# Patient Record
Sex: Male | Born: 1970 | Race: White | Hispanic: No | Marital: Married | Smoking: Never smoker
Health system: Southern US, Community
[De-identification: ages and names within clinical notes are randomized; demographics above are authoritative.]

## PROBLEM LIST (undated history)

## (undated) DIAGNOSIS — M419 Scoliosis, unspecified: Secondary | ICD-10-CM

## (undated) DIAGNOSIS — I82409 Acute embolism and thrombosis of unspecified deep veins of unspecified lower extremity: Secondary | ICD-10-CM

## (undated) DIAGNOSIS — N433 Hydrocele, unspecified: Secondary | ICD-10-CM

## (undated) DIAGNOSIS — K219 Gastro-esophageal reflux disease without esophagitis: Secondary | ICD-10-CM

## (undated) DIAGNOSIS — L509 Urticaria, unspecified: Secondary | ICD-10-CM

## (undated) DIAGNOSIS — M47819 Spondylosis without myelopathy or radiculopathy, site unspecified: Secondary | ICD-10-CM

## (undated) DIAGNOSIS — I10 Essential (primary) hypertension: Secondary | ICD-10-CM

## (undated) DIAGNOSIS — K449 Diaphragmatic hernia without obstruction or gangrene: Secondary | ICD-10-CM

## (undated) DIAGNOSIS — R569 Unspecified convulsions: Secondary | ICD-10-CM

## (undated) DIAGNOSIS — J309 Allergic rhinitis, unspecified: Secondary | ICD-10-CM

## (undated) DIAGNOSIS — G540 Brachial plexus disorders: Secondary | ICD-10-CM

## (undated) HISTORY — DX: Allergic rhinitis, unspecified: J30.9

## (undated) HISTORY — DX: Gastro-esophageal reflux disease without esophagitis: K21.9

## (undated) HISTORY — DX: Acute embolism and thrombosis of unspecified deep veins of unspecified lower extremity: I82.409

## (undated) HISTORY — PX: OTHER SURGICAL HISTORY: SHX169

## (undated) HISTORY — DX: Essential (primary) hypertension: I10

## (undated) HISTORY — DX: Urticaria, unspecified: L50.9

## (undated) HISTORY — DX: Unspecified convulsions: R56.9

## (undated) HISTORY — DX: Scoliosis, unspecified: M41.9

## (undated) HISTORY — DX: Brachial plexus disorders: G54.0

---

## 2009-03-20 DIAGNOSIS — G540 Brachial plexus disorders: Secondary | ICD-10-CM

## 2009-03-20 DIAGNOSIS — I82409 Acute embolism and thrombosis of unspecified deep veins of unspecified lower extremity: Secondary | ICD-10-CM

## 2009-03-20 DIAGNOSIS — Z86718 Personal history of other venous thrombosis and embolism: Secondary | ICD-10-CM

## 2009-03-20 HISTORY — DX: Brachial plexus disorders: G54.0

## 2009-03-20 HISTORY — DX: Acute embolism and thrombosis of unspecified deep veins of unspecified lower extremity: I82.409

## 2009-03-20 HISTORY — DX: Personal history of other venous thrombosis and embolism: Z86.718

## 2009-11-21 ENCOUNTER — Ambulatory Visit: Payer: Self-pay | Admitting: Vascular Surgery

## 2009-11-21 ENCOUNTER — Inpatient Hospital Stay (HOSPITAL_COMMUNITY): Admission: EM | Admit: 2009-11-21 | Discharge: 2009-11-29 | Payer: Self-pay | Admitting: *Deleted

## 2009-11-24 ENCOUNTER — Encounter: Payer: Self-pay | Admitting: Vascular Surgery

## 2010-01-31 HISTORY — PX: RIB RESECTION: SHX5077

## 2010-02-06 HISTORY — PX: VIDEO ASSISTED THORACOSCOPY (VATS)/DECORTICATION: SHX6171

## 2010-03-20 HISTORY — PX: ESOPHAGOGASTRODUODENOSCOPY (EGD) WITH ESOPHAGEAL DILATION: SHX5812

## 2010-06-02 LAB — CBC
HCT: 41.5 % (ref 39.0–52.0)
HCT: 42.9 % (ref 39.0–52.0)
HCT: 43.4 % (ref 39.0–52.0)
HCT: 43.6 % (ref 39.0–52.0)
HCT: 47.8 % (ref 39.0–52.0)
Hemoglobin: 14.9 g/dL (ref 13.0–17.0)
Hemoglobin: 15.1 g/dL (ref 13.0–17.0)
Hemoglobin: 15.3 g/dL (ref 13.0–17.0)
Hemoglobin: 15.4 g/dL (ref 13.0–17.0)
Hemoglobin: 16.8 g/dL (ref 13.0–17.0)
MCH: 30.1 pg (ref 26.0–34.0)
MCH: 30.5 pg (ref 26.0–34.0)
MCH: 30.5 pg (ref 26.0–34.0)
MCH: 30.7 pg (ref 26.0–34.0)
MCH: 30.7 pg (ref 26.0–34.0)
MCH: 30.8 pg (ref 26.0–34.0)
MCHC: 34.6 g/dL (ref 30.0–36.0)
MCHC: 34.7 g/dL (ref 30.0–36.0)
MCHC: 35 g/dL (ref 30.0–36.0)
MCHC: 35.1 g/dL (ref 30.0–36.0)
MCHC: 35.3 g/dL (ref 30.0–36.0)
MCV: 87.1 fL (ref 78.0–100.0)
MCV: 87.2 fL (ref 78.0–100.0)
MCV: 87.6 fL (ref 78.0–100.0)
MCV: 88.1 fL (ref 78.0–100.0)
MCV: 88.5 fL (ref 78.0–100.0)
Platelets: 211 10*3/uL (ref 150–400)
Platelets: 212 10*3/uL (ref 150–400)
Platelets: 222 10*3/uL (ref 150–400)
Platelets: 229 10*3/uL (ref 150–400)
Platelets: 245 10*3/uL (ref 150–400)
Platelets: 247 10*3/uL (ref 150–400)
RBC: 4.71 MIL/uL (ref 4.22–5.81)
RBC: 4.8 MIL/uL (ref 4.22–5.81)
RBC: 4.92 MIL/uL (ref 4.22–5.81)
RBC: 4.99 MIL/uL (ref 4.22–5.81)
RBC: 5.08 MIL/uL (ref 4.22–5.81)
RBC: 5.12 MIL/uL (ref 4.22–5.81)
RDW: 12.8 % (ref 11.5–15.5)
RDW: 12.9 % (ref 11.5–15.5)
RDW: 12.9 % (ref 11.5–15.5)
RDW: 13 % (ref 11.5–15.5)
WBC: 11 10*3/uL — ABNORMAL HIGH (ref 4.0–10.5)
WBC: 6.7 10*3/uL (ref 4.0–10.5)
WBC: 7.1 10*3/uL (ref 4.0–10.5)
WBC: 7.8 10*3/uL (ref 4.0–10.5)
WBC: 8.1 10*3/uL (ref 4.0–10.5)
WBC: 9.6 10*3/uL (ref 4.0–10.5)

## 2010-06-02 LAB — PROTIME-INR
INR: 1.01 (ref 0.00–1.49)
INR: 1.04 (ref 0.00–1.49)
INR: 1.2 (ref 0.00–1.49)
INR: 1.64 — ABNORMAL HIGH (ref 0.00–1.49)
INR: 2.51 — ABNORMAL HIGH (ref 0.00–1.49)
Prothrombin Time: 13.5 seconds (ref 11.6–15.2)
Prothrombin Time: 13.9 seconds (ref 11.6–15.2)
Prothrombin Time: 15.4 seconds — ABNORMAL HIGH (ref 11.6–15.2)
Prothrombin Time: 18.5 seconds — ABNORMAL HIGH (ref 11.6–15.2)
Prothrombin Time: 22.1 seconds — ABNORMAL HIGH (ref 11.6–15.2)

## 2010-06-02 LAB — BASIC METABOLIC PANEL
CO2: 26 mEq/L (ref 19–32)
CO2: 28 mEq/L (ref 19–32)
Calcium: 9 mg/dL (ref 8.4–10.5)
Calcium: 9.1 mg/dL (ref 8.4–10.5)
Chloride: 106 mEq/L (ref 96–112)
Chloride: 99 mEq/L (ref 96–112)
Creatinine, Ser: 1.03 mg/dL (ref 0.4–1.5)
Creatinine, Ser: 1.03 mg/dL (ref 0.4–1.5)
GFR calc Af Amer: >60 mL/min (ref >60)
GFR calc Af Amer: >60 mL/min (ref >60)
GFR calc non Af Amer: >60 mL/min (ref >60)
Glucose, Bld: 104 mg/dL — ABNORMAL HIGH (ref 70–99)
Potassium: 4 mEq/L (ref 3.5–5.1)
Sodium: 136 mEq/L (ref 135–145)
Sodium: 138 mEq/L (ref 135–145)
Sodium: 140 mEq/L (ref 135–145)

## 2010-06-02 LAB — MRSA PCR SCREENING: MRSA by PCR: POSITIVE — AB

## 2010-06-02 LAB — FIBRINOGEN
Fibrinogen: 218 mg/dL (ref 204–475)
Fibrinogen: 314 mg/dL (ref 204–475)
Fibrinogen: 326 mg/dL (ref 204–475)
Fibrinogen: 342 mg/dL (ref 204–475)
Fibrinogen: 387 mg/dL (ref 204–475)
Fibrinogen: 396 mg/dL (ref 204–475)

## 2010-06-02 LAB — DIFFERENTIAL
Lymphocytes Relative: 16 % (ref 12–46)
Lymphs Abs: 1.7 10*3/uL (ref 0.7–4.0)
Monocytes Relative: 9 % (ref 3–12)
Neutrophils Relative %: 71 % (ref 43–77)

## 2010-06-02 LAB — HEPARIN LEVEL (UNFRACTIONATED)
Heparin Unfractionated: 0.31 IU/mL (ref 0.30–0.70)
Heparin Unfractionated: 0.45 IU/mL (ref 0.30–0.70)
Heparin Unfractionated: 0.57 IU/mL (ref 0.30–0.70)
Heparin Unfractionated: 0.58 IU/mL (ref 0.30–0.70)
Heparin Unfractionated: 0.71 IU/mL — ABNORMAL HIGH (ref 0.30–0.70)
Heparin Unfractionated: <0.1 IU/mL — ABNORMAL LOW (ref 0.30–0.70)
Heparin Unfractionated: <0.1 IU/mL — ABNORMAL LOW (ref 0.30–0.70)

## 2010-06-02 LAB — POTASSIUM: Potassium: 4 mEq/L (ref 3.5–5.1)

## 2010-06-02 LAB — APTT: aPTT: 155 seconds — ABNORMAL HIGH (ref 24–37)

## 2012-05-10 IMAGING — US IR [PERSON_NAME]/EXT/UNI*L*
1 series · 1 of 1 positions shown · non-contrast
Comparison: none

CLINICAL DATA: 38-year-old male acute left upper extremity
occlusive DVT, left arm swelling and discoloration

[Series 1: ir (person_name)/ext/uni*left* · 1 of 1 slices shown]
[im 1/1]
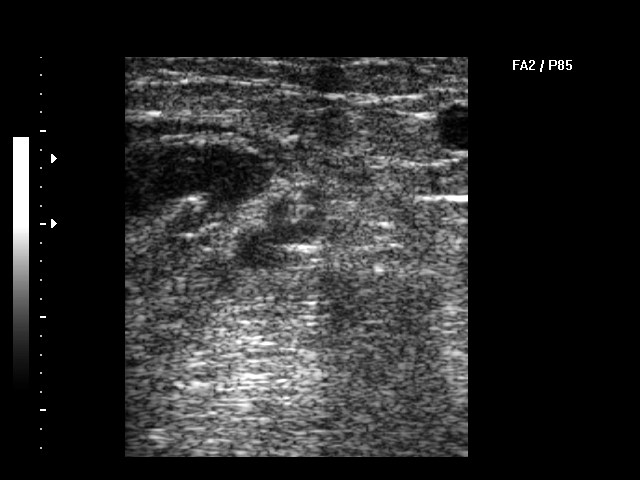

[1 of 1 positions shown; findings below may reference images not displayed]

ULTRASOUND GUIDANCE FOR VASCULAR ACCESS
LEFT UPPER EXTREMITY VENOGRAM
LEFT UPPER EXTREMITY TRANSCATHETER THROMBOLYSIS INITIATION

Date:  11/21/2009 [DATE]

Radiologist:  Lowtun Vergest, M.D.

Medications:  1 mg Versed, 25 mcg Fentanyl

Guidance:  Fluoroscopic

Fluoroscopy time:  3.6 minutes

Sedation time:  15 minutes

Contrast volume:  50 ml 0mnipaque-GHH

Complications:  No immediate

PROCEDURE/FINDINGS:

Informed consent was obtained from the patient following
explanation of the procedure, risks, benefits and alternatives.
The patient understands, agrees and consents for the procedure.
All questions were addressed.  A time out was performed.

Maximal barrier sterile technique utilized including caps, mask,
sterile gowns, sterile gloves, large sterile drape, hand hygiene,
and betadine

Under sterile conditions and local anesthesia, left brachial venous
access was performed above the elbow.  Over a guide wire, a 6-
French sheath was inserted.  A Kumpe catheter was advanced into the
left upper extremity brachial vein and contrast injection was
performed for venography.

Left upper extremity venogram:  Occlusive DVT is evident from the
mid humerus brachial vein, axillary vein, and subclavian vein.
This extends to the left first rib where there is irregularity of
the subclavian vein and narrowing consistent with venous thoracic
outlet syndrome.  With a catheter and a guide wire, the subclavian
stenosis was crossed.  Contrast injection confirms patency of the
left innominate vein and SVC.

Measurements were obtained for the appropriate infusion catheter
length.  A 5-French 90-cm infusion catheter with a 40-cm infusion
length was advanced and centered on the left upper extremity
occlusive DVT from the left subclavian vein back to the left
brachial vein.  Transcatheter thrombolytic infusion will be
initiated.

Catheter access was secured externally.  No immediate complication.
The patient tolerated procedure well.

Findings discussed with the patient, family members, and Dr. Marindi
IMPRESSION: Acute occlusive left subclavian, axillary and brachial venous
thrombosis with an associated subclavian stenosis at the first rib
consistent with thoracic outlet syndrome.

Initiation of transcatheter venous thrombolysis with KUMBERGER at 0.5 mg
per hour

## 2012-05-16 IMAGING — XA IR ANGIO/EXISTING CATHETER
1 series · 13 of 20 positions shown · non-contrast
Comparison: none

CLINICAL DATA: Effort venous thrombosis of the left upper
extremity.  The patient is now receiving a second course of
transcatheter thrombolytic therapy which started on 11/25/2009
after rethrombosis after initial course 1 week ago.

[Series 1: run · 13 of 20 slices shown]
[im 1/20]
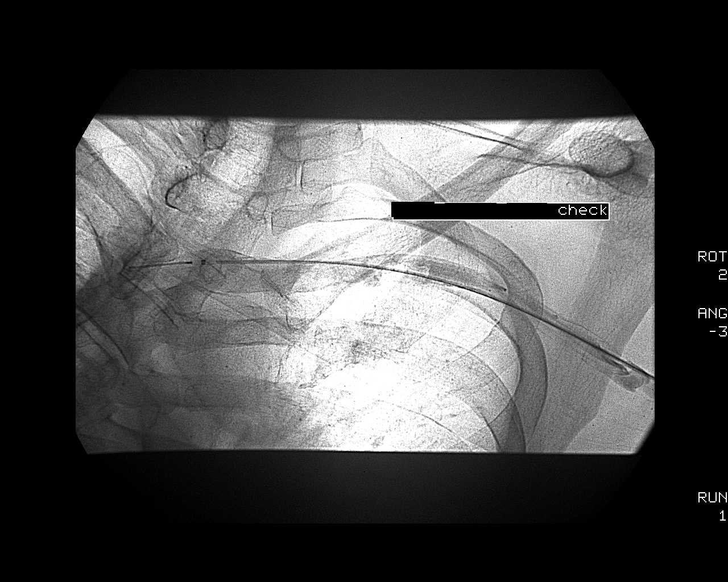
[im 3/20]
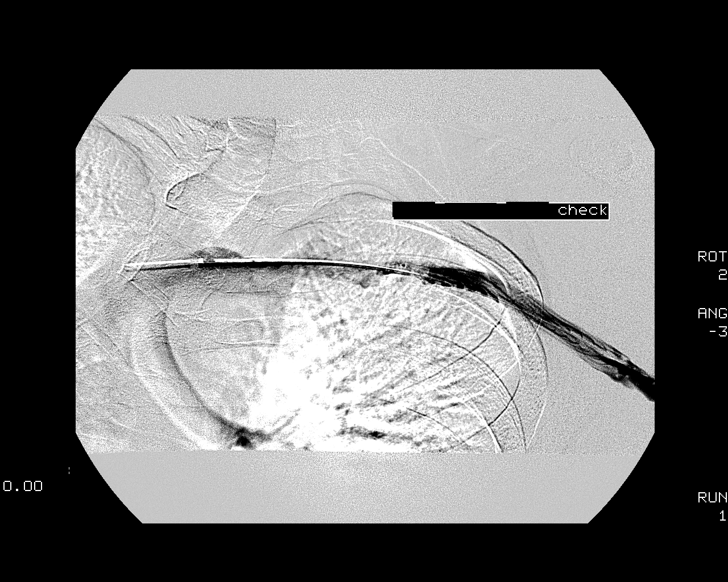
[im 4/20]
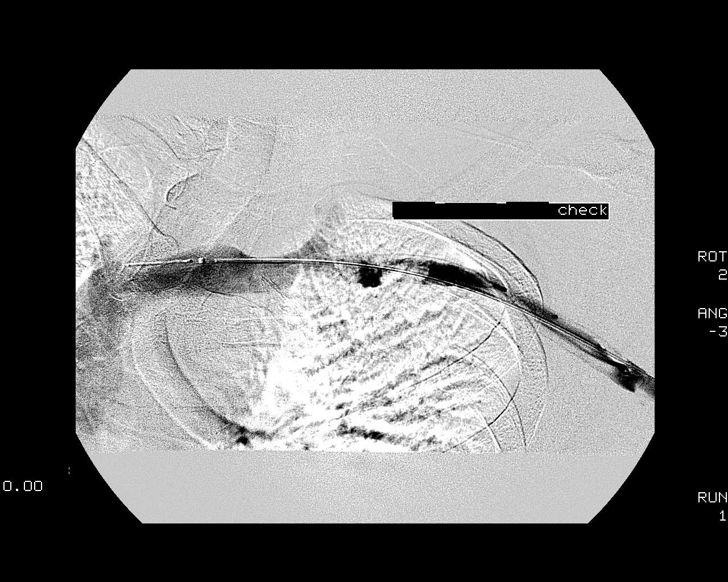
[im 6/20]
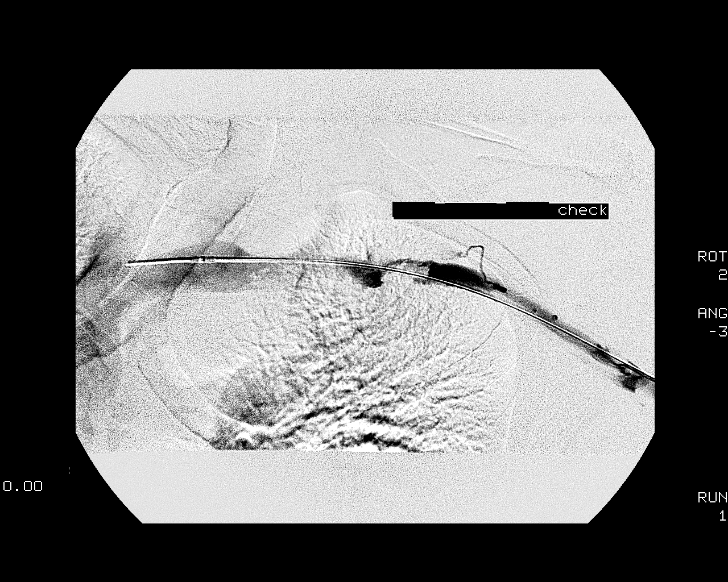
[im 7/20]
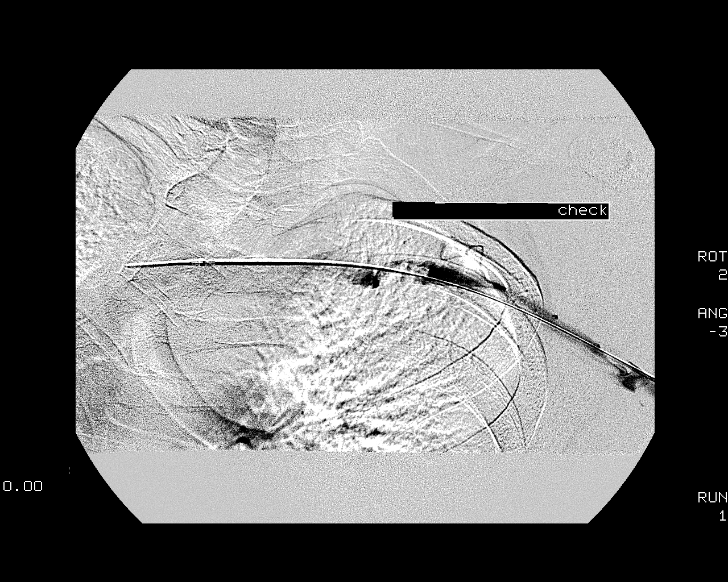
[im 9/20]
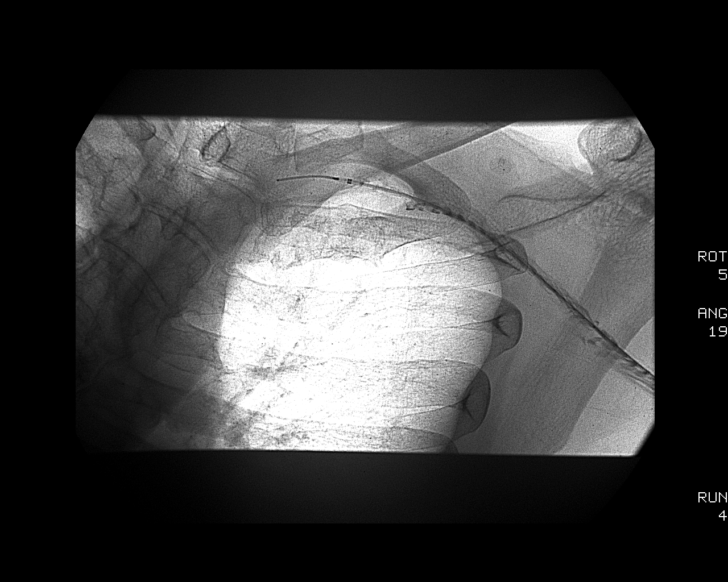
[im 11/20]
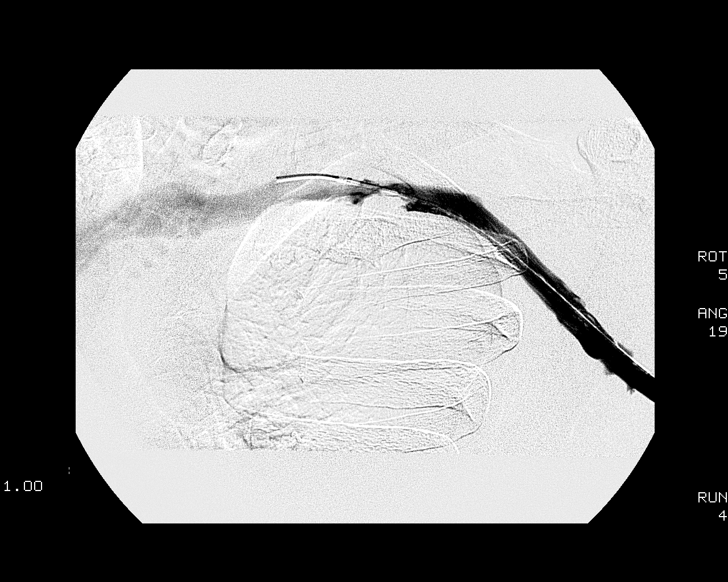
[im 12/20]
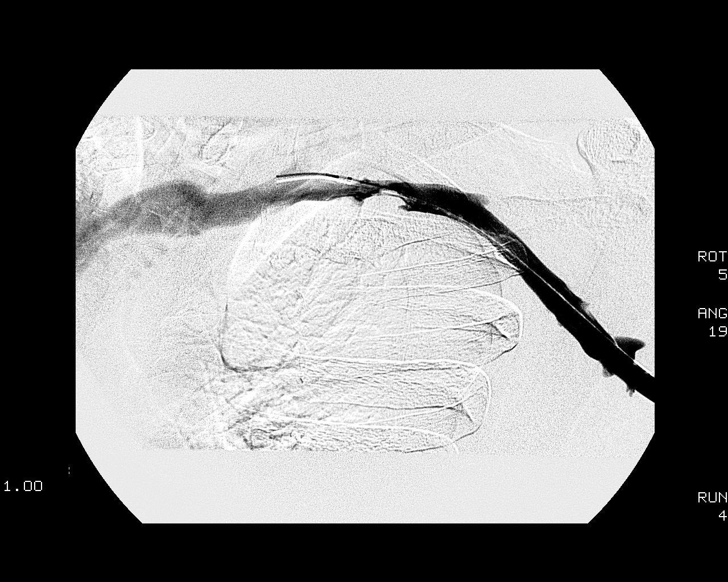
[im 14/20]
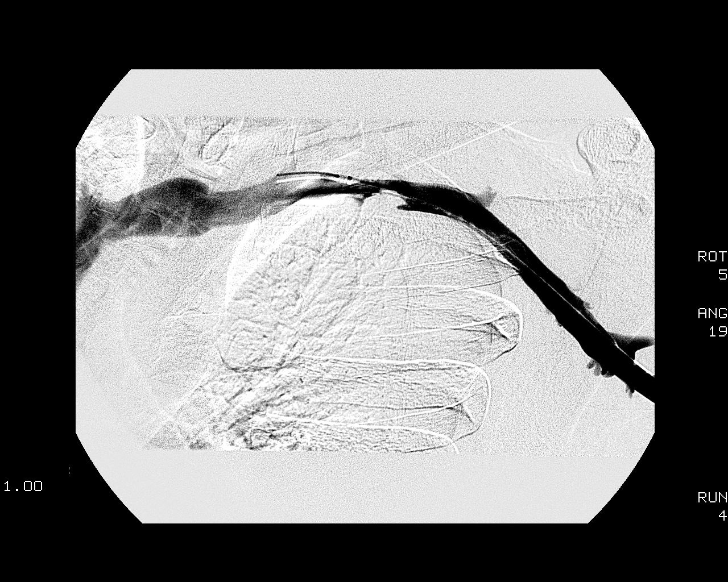
[im 15/20]
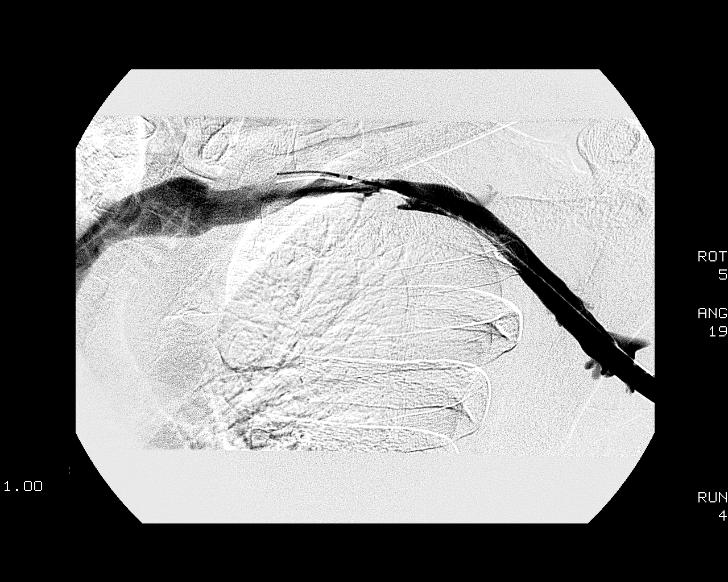
[im 17/20]
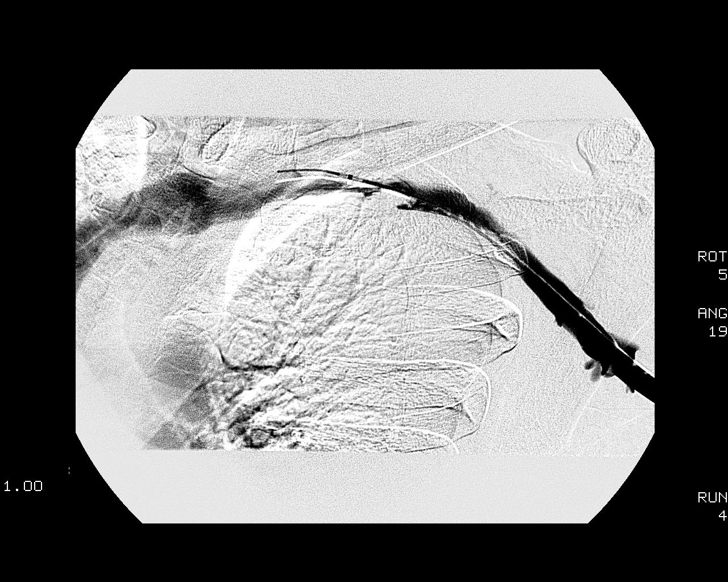
[im 18/20]
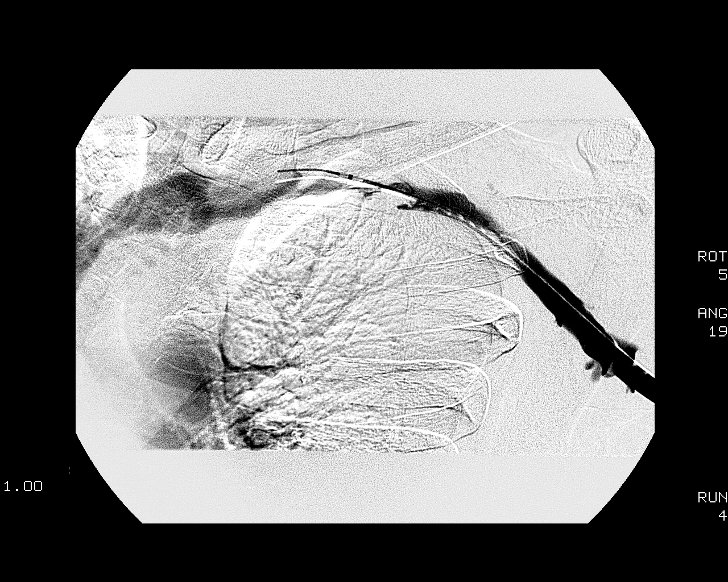
[im 20/20]
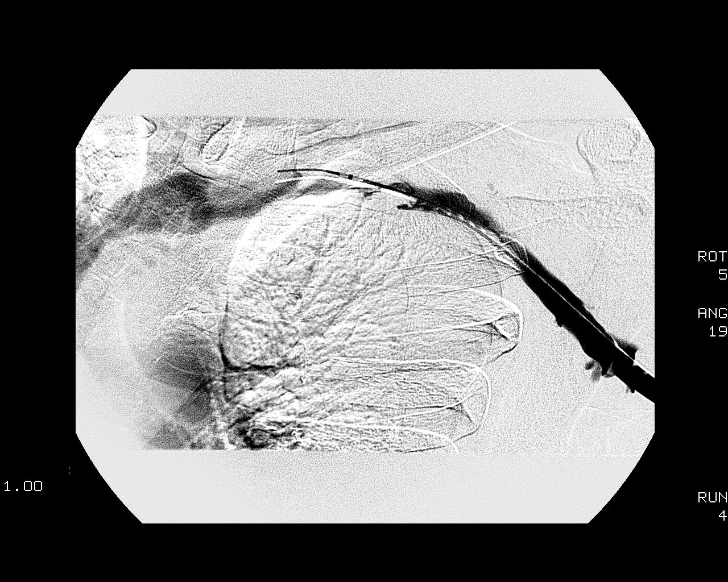

[13 of 20 positions shown; findings below may reference images not displayed]

FOLLOW-UP ANGIOGRAPHY DURING THROMBOLYTIC THERAPY

Contrast:  40 ml Amnipaque-9CC

Fluoroscopy Time: 0.5 minutes.

Procedure:  The procedure, risks, benefits, and alternatives were
explained to the patient.  Questions regarding the procedure were
encouraged and answered.  The patient understands and consents to
the procedure.

The preexisting infusion catheter was prepped with Betadine.
Contrast injection was perfor[REDACTED]ed over the left upper chest
and axillary regions.  Infusion catheter and sheath were then
removed.  Hemostasis was obtained with manual compression.

Complications: None
FINDINGS: Follow-up venography shows complete lysis of thrombus in
the axillary and subclavian venous segments.  There is residual
chronic stenosis of the subclavian vein at the thoracic inlet with
approximately 75 - 80% narrowing present.  Flow is present through
this segment into the innominate vein which is widely patent.
Infusion catheter and sheath were removed and thrombolytic infusion
discontinued.
IMPRESSION: Resolution of venous thrombus in the axillary and subclavian veins.
Chronic stenosis remains present of the subclavian vein at the
thoracic inlet.  Balloon angioplasty was not performed given the
immediate recoil and rethrombosis after prior thrombolytic therapy
and subclavian vein angioplasty.  Findings were discussed with Dr.
No.

## 2015-12-28 ENCOUNTER — Encounter (INDEPENDENT_AMBULATORY_CARE_PROVIDER_SITE_OTHER): Payer: Self-pay

## 2015-12-28 ENCOUNTER — Encounter: Payer: Self-pay | Admitting: Allergy

## 2015-12-28 ENCOUNTER — Ambulatory Visit (INDEPENDENT_AMBULATORY_CARE_PROVIDER_SITE_OTHER): Payer: Commercial Managed Care - PPO | Admitting: Allergy

## 2015-12-28 VITALS — BP 138/94 | HR 88 | Temp 98.0°F | Resp 20 | Ht 74.0 in | Wt 272.0 lb

## 2015-12-28 DIAGNOSIS — L505 Cholinergic urticaria: Secondary | ICD-10-CM

## 2015-12-28 DIAGNOSIS — J309 Allergic rhinitis, unspecified: Secondary | ICD-10-CM

## 2015-12-28 DIAGNOSIS — H1010 Acute atopic conjunctivitis, unspecified eye: Secondary | ICD-10-CM | POA: Diagnosis not present

## 2015-12-28 MED ORDER — AZELASTINE HCL 0.1 % NA SOLN: NASAL | 5 refills | Status: DC

## 2015-12-28 MED ORDER — MONTELUKAST SODIUM 10 MG PO TABS: ORAL_TABLET | ORAL | 5 refills | Status: DC

## 2015-12-28 NOTE — Progress Notes (Signed)
New Patient Note  RE: Todd Powers MRN: [id number] DOB: 01/04/1971 Date of Office Visit: 12/28/2015  Referring provider: No ref. provider found Primary care provider: No primary care provider on file.  Chief Complaint: Rash  History of present illness: Todd GreetBrandunn Hakimian is a 45 y.o. male presenting today for evaluation of rash. He is here today with his wife.  He is a former patient of ours with his last visit in 2009.  He was previously being treated for his environmental allergies. He did well for years and thus did not feel he needed to see us in follow-up.  He returns today as he has had the development of a rash that started 4 days ago.  He is unsure as to what triggered the rash.  His wife states the rash started on Saturday morning after he took a hot shower and then he went outside. Wife mentioned that he had some sweat on his face she believed was from his shower. She then noted that his face was becoming red with splotches that appear to be raised. He then noted that the rash seemed to progress down to his neck and upper chest. He has also noticed the rash in his inner thigh area.  He did state he had a pumpkin spice doughnut the morning prior to the onset of the rash that he hadn't had any while but has had before without a problem. And he has not continued to eat doughnut.  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 He states that he has been having to wear a jumpsuit like attire and is under bright lights in the warehouse which seems to worsen his rash. Rash is somewhat itchy and slightly warm to touch.  He denies any fever, joint pain, bruising/ marks.  The rash does come and go but he seems to have some evidence of rash throughout the day.   His wife mentioned that the rash looks similar to a rash he used to develop with dust mite exposure.  He does report having a sinus infection 2 weeks ago and was treated.  Otherwise he denies any new medications, no stings, no change in soaps or lotions or detergents. He has been avoiding lotions since this started andhis skin is becoming dry.  No other household members have similar symptoms.  Has been taking zyrtec and benadryl tab daily since Saturday as well as zantac.   He received a steroid shot for his scoliosis last week.  [facility] and got another kenalog shot and advised to take zantac.   He does have a history of allergic rhinitis and does take Zyrtec daily which helps control his allergy symptoms. He denies a history of asthma or eczema. No food allergy concern.   Review of systems: Review of Systems  Constitutional: Negative for chills, fever and malaise/fatigue.  HENT: Negative for congestion and sore throat.   Eyes: Negative for redness.  Respiratory: Negative for cough, shortness of breath and wheezing.   Cardiovascular: Negative for chest pain.  Gastrointestinal: Negative for nausea and vomiting.  Skin: Positive for itching and rash.  Neurological: Negative for headaches.    All other systems negative unless noted above in HPI  Past medical history: Past Medical History:  Diagnosis Date  . Allergic rhinitis   . DVT (deep venous thrombosis) (HCC) 2011  . GERD (gastroesophageal reflux disease)   .  Hypertension   . Scoliosis   . Thoracic outlet syndrome 2011  . Urticaria    Past surgical history: Past Surgical History:  Procedure Laterality Date  . rib removed      Family history:  Family History  Problem Relation Age of Onset  . Allergic rhinitis Father   . Allergic rhinitis Daughter   No family history of urticaria or angioedema  Social history: He lives with his wife and children NA home with out carpeting with gas heating and central cooling. There are no pets in the home. He is the VP of sales for [employer]   Medication List:   Medication List       Accurate as of 12/28/15  4:38 PM.  Always use your most recent med list.          azelastine 0.1 % nasal spray Commonly known as:  ASTELIN Use two sprays in each nostril twice daily as directed   BELVIQ PO Take by mouth.   BENADRYL PO Take by mouth.   cetirizine 10 MG tablet Commonly known as:  ZYRTEC Take 10 mg by mouth daily.   COQ10 PO Take by mouth.   FIBER PO Take by mouth.   lisinopril-hydrochlorothiazide 10-12.5 MG tablet Commonly known as:  PRINZIDE,ZESTORETIC   montelukast 10 MG tablet Commonly known as:  SINGULAIR Take one tablet once daily as directed   MULTIVITAMIN PO Take by mouth.   nabumetone 750 MG tablet Commonly known as:  RELAFEN   omeprazole 20 MG capsule Commonly known as:  PRILOSEC   ranitidine 150 MG tablet Commonly known as:  ZANTAC Take 150 mg by mouth daily.       Known medication allergies: Allergies  Allergen Reactions  . Diphenhydramine Other (See Comments)    Other Reaction: ???Rash  . Morphine And Related Hives and Nausea And Vomiting  . Mucinex [Guaifenesin Er] Other (See Comments)    Light headed  . Penicillins Nausea And Vomiting     Physical examination: Blood pressure (!) 138/94, pulse 88, temperature 98 F (36.7 C), temperature source Oral, resp. rate 20, height 6\' 2"  (1.88 m), weight 272 lb (123.4 kg).  General: Alert, interactive, in no acute distress. HEENT: TMs pearly gray, turbinates non-edematous without discharge, post-pharynx non erythematous. Neck: Supple without lymphadenopathy. Lungs: Clear to auscultation without wheezing, rhonchi or rales. {no increased work of breathing. CV: Normal S1, S2 without murmurs. Abdomen: Nondistended, nontender. Skin: Scattered erythematous papules some pinpoint in size some that coalesce into larger lesions, non-blanchable. Extremities:  No clubbing, cyanosis or edema. Neuro:   Grossly intact.  Diagnositics/Labs: Allergy testing: Deferred due to ongoing urticaria  Assessment and plan:     Urticaria    - Apparently description of this rash seems consistent with cholinergic urticaria. Cholinergic urticaria (sometimes called generalized heat urticaria) describes hives with a characteristic appearance that are precipitated by active or passive heating of the body, exercise, strong emotions, and bathing in hot water.  Many patients note a tingling, itching, or burning sensation of the skin before the appearance of the hives.      - Start Cetirizine 10mg  and Zantac 150mg  twice a day    - take Singulair 10mg  at bedtime    - stop benadryl use    - as much as possible try to stay cool -- cold drinks, cool packs, avoid hot showers  Allergic rhinoconjunctivitis    - continue antihistamine as above    - use Flonase or Nasonex 2 sprays each nostril as  needed for congestion/drainage    - may add on Astelin 2 sprays twice a day for post-nasal drainage  Follow-up 2-3 months  I appreciate the opportunity to take part in Todd Powers's care. Please do not hesitate to contact me with questions.  Sincerely,   Margo Aye, MD Allergy/Immunology [facility]

## 2015-12-28 NOTE — Patient Instructions (Signed)
Hives, cholinergic    - Cholinergic urticaria (sometimes called generalized heat urticaria) describes hives with a characteristic appearance that are precipitated by active or passive heating of the body, exercise, strong emotions, and bathing in hot water.  Many patients note a tingling, itching, or burning sensation of the skin before the appearance of the hives    - Start Cetirizine 10mg  and Zantac 150mg  twice a day    - take Singulair 10mg  at bedtime    - stop benadryl use    - as much as possible try to stay cool -- cold drinks, cool packs, avoid hot showers   Allergies    - continue antihistamine as above    - use Flonase or Nasonex 2 sprays each nostril as needed for congestion/drainage    - may add on Astelin 2 sprays twice a day for post-nasal drainage  Follow-up 2-3 months

## 2016-03-20 HISTORY — PX: NASAL SEPTUM SURGERY: SHX37

## 2016-03-31 ENCOUNTER — Ambulatory Visit: Payer: Commercial Managed Care - PPO | Admitting: Allergy and Immunology

## 2016-03-31 DIAGNOSIS — N401 Enlarged prostate with lower urinary tract symptoms: Secondary | ICD-10-CM | POA: Diagnosis not present

## 2016-03-31 DIAGNOSIS — N400 Enlarged prostate without lower urinary tract symptoms: Secondary | ICD-10-CM | POA: Diagnosis not present

## 2016-03-31 DIAGNOSIS — N3020 Other chronic cystitis without hematuria: Secondary | ICD-10-CM | POA: Diagnosis not present

## 2016-04-07 ENCOUNTER — Ambulatory Visit: Payer: Commercial Managed Care - PPO | Admitting: Allergy and Immunology

## 2016-04-11 DIAGNOSIS — J3489 Other specified disorders of nose and nasal sinuses: Secondary | ICD-10-CM | POA: Diagnosis not present

## 2016-04-11 DIAGNOSIS — J343 Hypertrophy of nasal turbinates: Secondary | ICD-10-CM | POA: Diagnosis not present

## 2016-04-27 DIAGNOSIS — J0100 Acute maxillary sinusitis, unspecified: Secondary | ICD-10-CM | POA: Diagnosis not present

## 2016-05-26 DIAGNOSIS — J343 Hypertrophy of nasal turbinates: Secondary | ICD-10-CM | POA: Diagnosis not present

## 2016-05-26 DIAGNOSIS — J3489 Other specified disorders of nose and nasal sinuses: Secondary | ICD-10-CM | POA: Diagnosis not present

## 2016-06-14 DIAGNOSIS — M5432 Sciatica, left side: Secondary | ICD-10-CM | POA: Diagnosis not present

## 2016-06-22 DIAGNOSIS — J019 Acute sinusitis, unspecified: Secondary | ICD-10-CM | POA: Insufficient documentation

## 2016-06-22 DIAGNOSIS — B9689 Other specified bacterial agents as the cause of diseases classified elsewhere: Secondary | ICD-10-CM | POA: Insufficient documentation

## 2016-06-22 HISTORY — DX: Acute sinusitis, unspecified: J01.90

## 2016-08-02 DIAGNOSIS — G8929 Other chronic pain: Secondary | ICD-10-CM | POA: Diagnosis not present

## 2016-08-02 DIAGNOSIS — M5442 Lumbago with sciatica, left side: Secondary | ICD-10-CM | POA: Diagnosis not present

## 2016-09-08 DIAGNOSIS — J0100 Acute maxillary sinusitis, unspecified: Secondary | ICD-10-CM | POA: Diagnosis not present

## 2016-10-19 DIAGNOSIS — J209 Acute bronchitis, unspecified: Secondary | ICD-10-CM | POA: Diagnosis not present

## 2016-10-22 DIAGNOSIS — R062 Wheezing: Secondary | ICD-10-CM | POA: Diagnosis not present

## 2016-10-22 DIAGNOSIS — J209 Acute bronchitis, unspecified: Secondary | ICD-10-CM | POA: Diagnosis not present

## 2016-10-22 DIAGNOSIS — R05 Cough: Secondary | ICD-10-CM | POA: Diagnosis not present

## 2016-11-16 DIAGNOSIS — M4716 Other spondylosis with myelopathy, lumbar region: Secondary | ICD-10-CM | POA: Diagnosis not present

## 2016-11-16 DIAGNOSIS — M5416 Radiculopathy, lumbar region: Secondary | ICD-10-CM | POA: Diagnosis not present

## 2016-11-16 DIAGNOSIS — M545 Low back pain: Secondary | ICD-10-CM | POA: Diagnosis not present

## 2016-12-04 DIAGNOSIS — M4716 Other spondylosis with myelopathy, lumbar region: Secondary | ICD-10-CM | POA: Diagnosis not present

## 2016-12-04 DIAGNOSIS — M5127 Other intervertebral disc displacement, lumbosacral region: Secondary | ICD-10-CM | POA: Diagnosis not present

## 2016-12-04 DIAGNOSIS — M48061 Spinal stenosis, lumbar region without neurogenic claudication: Secondary | ICD-10-CM | POA: Diagnosis not present

## 2016-12-04 DIAGNOSIS — M545 Low back pain: Secondary | ICD-10-CM | POA: Diagnosis not present

## 2016-12-05 DIAGNOSIS — M5117 Intervertebral disc disorders with radiculopathy, lumbosacral region: Secondary | ICD-10-CM | POA: Diagnosis not present

## 2016-12-05 DIAGNOSIS — M545 Low back pain: Secondary | ICD-10-CM | POA: Diagnosis not present

## 2017-01-04 DIAGNOSIS — M4125 Other idiopathic scoliosis, thoracolumbar region: Secondary | ICD-10-CM | POA: Diagnosis not present

## 2017-01-04 DIAGNOSIS — M419 Scoliosis, unspecified: Secondary | ICD-10-CM | POA: Diagnosis not present

## 2017-01-04 DIAGNOSIS — M48062 Spinal stenosis, lumbar region with neurogenic claudication: Secondary | ICD-10-CM | POA: Diagnosis not present

## 2017-01-24 DIAGNOSIS — J0100 Acute maxillary sinusitis, unspecified: Secondary | ICD-10-CM | POA: Diagnosis not present

## 2017-02-16 DIAGNOSIS — M48062 Spinal stenosis, lumbar region with neurogenic claudication: Secondary | ICD-10-CM | POA: Diagnosis not present

## 2017-02-16 DIAGNOSIS — M5416 Radiculopathy, lumbar region: Secondary | ICD-10-CM | POA: Diagnosis not present

## 2017-02-16 DIAGNOSIS — Z4689 Encounter for fitting and adjustment of other specified devices: Secondary | ICD-10-CM | POA: Diagnosis not present

## 2017-02-16 DIAGNOSIS — M545 Low back pain: Secondary | ICD-10-CM | POA: Diagnosis not present

## 2017-02-28 DIAGNOSIS — M48062 Spinal stenosis, lumbar region with neurogenic claudication: Secondary | ICD-10-CM | POA: Diagnosis not present

## 2017-02-28 DIAGNOSIS — M5416 Radiculopathy, lumbar region: Secondary | ICD-10-CM | POA: Diagnosis not present

## 2017-02-28 DIAGNOSIS — Z01818 Encounter for other preprocedural examination: Secondary | ICD-10-CM | POA: Diagnosis not present

## 2017-02-28 DIAGNOSIS — M545 Low back pain: Secondary | ICD-10-CM | POA: Diagnosis not present

## 2017-03-06 DIAGNOSIS — M5117 Intervertebral disc disorders with radiculopathy, lumbosacral region: Secondary | ICD-10-CM | POA: Diagnosis not present

## 2017-03-06 DIAGNOSIS — M419 Scoliosis, unspecified: Secondary | ICD-10-CM | POA: Diagnosis not present

## 2017-03-06 DIAGNOSIS — M4716 Other spondylosis with myelopathy, lumbar region: Secondary | ICD-10-CM | POA: Diagnosis not present

## 2017-03-06 DIAGNOSIS — M48062 Spinal stenosis, lumbar region with neurogenic claudication: Secondary | ICD-10-CM | POA: Insufficient documentation

## 2017-03-06 DIAGNOSIS — M5126 Other intervertebral disc displacement, lumbar region: Secondary | ICD-10-CM | POA: Diagnosis not present

## 2017-03-06 DIAGNOSIS — M4726 Other spondylosis with radiculopathy, lumbar region: Secondary | ICD-10-CM | POA: Diagnosis not present

## 2017-03-06 DIAGNOSIS — Z981 Arthrodesis status: Secondary | ICD-10-CM | POA: Diagnosis not present

## 2017-03-06 DIAGNOSIS — M5116 Intervertebral disc disorders with radiculopathy, lumbar region: Secondary | ICD-10-CM | POA: Diagnosis not present

## 2017-03-06 HISTORY — PX: LUMBAR LAMINECTOMY/DECOMPRESSION MICRODISCECTOMY: SHX5026

## 2017-03-07 DIAGNOSIS — M5116 Intervertebral disc disorders with radiculopathy, lumbar region: Secondary | ICD-10-CM | POA: Diagnosis not present

## 2017-03-07 DIAGNOSIS — M5117 Intervertebral disc disorders with radiculopathy, lumbosacral region: Secondary | ICD-10-CM | POA: Diagnosis not present

## 2017-03-07 DIAGNOSIS — M4726 Other spondylosis with radiculopathy, lumbar region: Secondary | ICD-10-CM | POA: Diagnosis not present

## 2017-05-16 DIAGNOSIS — J0100 Acute maxillary sinusitis, unspecified: Secondary | ICD-10-CM | POA: Diagnosis not present

## 2017-06-07 DIAGNOSIS — M545 Low back pain: Secondary | ICD-10-CM | POA: Diagnosis not present

## 2017-06-07 DIAGNOSIS — M5117 Intervertebral disc disorders with radiculopathy, lumbosacral region: Secondary | ICD-10-CM | POA: Diagnosis not present

## 2017-06-07 DIAGNOSIS — M48062 Spinal stenosis, lumbar region with neurogenic claudication: Secondary | ICD-10-CM | POA: Diagnosis not present

## 2017-06-07 DIAGNOSIS — J019 Acute sinusitis, unspecified: Secondary | ICD-10-CM | POA: Diagnosis not present

## 2017-07-19 DIAGNOSIS — J302 Other seasonal allergic rhinitis: Secondary | ICD-10-CM | POA: Diagnosis not present

## 2017-08-17 DIAGNOSIS — L700 Acne vulgaris: Secondary | ICD-10-CM | POA: Diagnosis not present

## 2017-08-31 DIAGNOSIS — S0500XA Injury of conjunctiva and corneal abrasion without foreign body, unspecified eye, initial encounter: Secondary | ICD-10-CM | POA: Diagnosis not present

## 2017-09-15 DIAGNOSIS — T7840XA Allergy, unspecified, initial encounter: Secondary | ICD-10-CM | POA: Diagnosis not present

## 2017-09-17 DIAGNOSIS — L501 Idiopathic urticaria: Secondary | ICD-10-CM | POA: Diagnosis not present

## 2017-10-10 DIAGNOSIS — L821 Other seborrheic keratosis: Secondary | ICD-10-CM | POA: Diagnosis not present

## 2017-10-10 DIAGNOSIS — L700 Acne vulgaris: Secondary | ICD-10-CM | POA: Diagnosis not present

## 2017-11-02 DIAGNOSIS — J0100 Acute maxillary sinusitis, unspecified: Secondary | ICD-10-CM | POA: Diagnosis not present

## 2017-12-05 DIAGNOSIS — L700 Acne vulgaris: Secondary | ICD-10-CM | POA: Diagnosis not present

## 2017-12-12 DIAGNOSIS — L700 Acne vulgaris: Secondary | ICD-10-CM | POA: Diagnosis not present

## 2018-01-31 DIAGNOSIS — J0191 Acute recurrent sinusitis, unspecified: Secondary | ICD-10-CM | POA: Diagnosis not present

## 2018-03-27 DIAGNOSIS — L309 Dermatitis, unspecified: Secondary | ICD-10-CM | POA: Diagnosis not present

## 2018-05-17 DIAGNOSIS — N400 Enlarged prostate without lower urinary tract symptoms: Secondary | ICD-10-CM | POA: Diagnosis not present

## 2018-05-17 DIAGNOSIS — N3020 Other chronic cystitis without hematuria: Secondary | ICD-10-CM | POA: Diagnosis not present

## 2018-05-21 DIAGNOSIS — J328 Other chronic sinusitis: Secondary | ICD-10-CM | POA: Diagnosis not present

## 2018-05-21 DIAGNOSIS — J343 Hypertrophy of nasal turbinates: Secondary | ICD-10-CM | POA: Diagnosis not present

## 2018-05-21 DIAGNOSIS — R0683 Snoring: Secondary | ICD-10-CM | POA: Insufficient documentation

## 2018-05-28 DIAGNOSIS — J328 Other chronic sinusitis: Secondary | ICD-10-CM | POA: Diagnosis not present

## 2018-05-28 DIAGNOSIS — J322 Chronic ethmoidal sinusitis: Secondary | ICD-10-CM | POA: Diagnosis not present

## 2019-12-22 ENCOUNTER — Other Ambulatory Visit: Payer: Self-pay

## 2019-12-22 ENCOUNTER — Encounter: Payer: Self-pay | Admitting: Podiatry

## 2019-12-22 ENCOUNTER — Ambulatory Visit (INDEPENDENT_AMBULATORY_CARE_PROVIDER_SITE_OTHER): Payer: Managed Care, Other (non HMO) | Admitting: Podiatry

## 2019-12-22 DIAGNOSIS — B070 Plantar wart: Secondary | ICD-10-CM

## 2019-12-22 DIAGNOSIS — Q828 Other specified congenital malformations of skin: Secondary | ICD-10-CM | POA: Diagnosis not present

## 2019-12-22 NOTE — Progress Notes (Signed)
  Subjective:  Patient ID: Todd Powers, male    DOB: [birth year],  MRN: [id number]  Chief Complaint  Patient presents with  . Foot Problem    there is a hard spot o nthe ball of the right foot     49 y.o. male presents with the above complaint. History confirmed with patient.   Objective:  Physical Exam: warm, good capillary refill, no trophic changes or ulcerative lesions, normal DP and PT pulses and normal sensory exam. Left Foot: normal exam, no swelling, tenderness, instability; ligaments intact, full range of motion of all ankle/foot joints  Right Foot: normal exam, no swelling, tenderness, instability; ligaments intact, full range of motion of all ankle/foot joints HPK forefoot  Assessment:   1. Verruca plantaris   2. Porokeratosis    Plan:  Patient was evaluated and treated and all questions answered.  Porokeratosis -Educated on etiology -Lesion debrided and destroyed -F/u in 6 weeks for recheck.  Procedure: Destruction of Lesion Location: right forefoot Anesthesia: none Instrumentation: 15 blade. Technique: Debridement of lesion, aperture pad applied around lesion. Small amount of salinocaineapplied to the base of the lesion. Dressing: Dry, sterile, compression dressing. Disposition: Patient tolerated procedure well. Advised to leave dressing on for 6-8 hours. Thereafter patient to wash the area with soap and water and applied band-aid. Off-loading pads dispensed.   Return in about 6 weeks (around 02/02/2020) for Skin lesion f/u.

## 2019-12-23 NOTE — Addendum Note (Signed)
Addended by: Hadley Pen R on: 12/23/2019 10:38 AM   Modules accepted: Orders

## 2020-02-02 ENCOUNTER — Ambulatory Visit: Payer: Managed Care, Other (non HMO) | Admitting: Podiatry

## 2020-02-10 ENCOUNTER — Ambulatory Visit: Payer: Managed Care, Other (non HMO) | Admitting: Sports Medicine

## 2020-03-02 ENCOUNTER — Encounter: Payer: Self-pay | Admitting: Sports Medicine

## 2020-03-02 ENCOUNTER — Ambulatory Visit (INDEPENDENT_AMBULATORY_CARE_PROVIDER_SITE_OTHER): Payer: Managed Care, Other (non HMO) | Admitting: Sports Medicine

## 2020-03-02 ENCOUNTER — Other Ambulatory Visit: Payer: Self-pay

## 2020-03-02 DIAGNOSIS — B070 Plantar wart: Secondary | ICD-10-CM | POA: Diagnosis not present

## 2020-03-02 DIAGNOSIS — M79671 Pain in right foot: Secondary | ICD-10-CM

## 2020-03-02 DIAGNOSIS — Q828 Other specified congenital malformations of skin: Secondary | ICD-10-CM

## 2020-03-02 NOTE — Progress Notes (Signed)
Subjective: Todd Powers is a 49 y.o. male patient who presents to office for evaluation of Right> Left foot pain secondary to moderately painful wart at the ball of the right foot.  Patient has tried previous treatment with Dr. Samuella Cota with no relief in symptoms. Patient denies any other pedal complaints.   Patient Active Problem List   Diagnosis Date Noted  . Lumbar stenosis with neurogenic claudication 03/06/2017  . Acute sinusitis 06/22/2016  . 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 Allergic rhinoconjunctivitis 12/28/2015    Current Outpatient Medications on File Prior to Visit  Medication Sig Dispense Refill  . cetirizine (ZYRTEC) 10 MG tablet Take 10 mg by mouth daily.    Marland Kitchen doxycycline (VIBRAMYCIN) 100 MG capsule Take 100 mg by mouth 2 (two) times daily.    Marland Kitchen lisinopril-hydrochlorothiazide (PRINZIDE,ZESTORETIC) 10-12.5 MG tablet     . Multiple Vitamins-Minerals (MULTIVITAMIN WITH MINERALS) tablet Take 1 tablet by mouth daily.    . nabumetone (RELAFEN) 750 MG tablet     . omeprazole (PRILOSEC) 20 MG capsule     . phentermine (ADIPEX-P) 37.5 MG tablet Take by mouth.    . Red Yeast Rice Extract (RED YEAST RICE PO) Take by mouth.     No current facility-administered medications on file prior to visit.    Allergies  Allergen Reactions  . Diphenhydramine Other (See Comments)    Other Reaction: ???Rash  . Armoracia Rusticana Ext (Horseradish) Nausea And Vomiting  . Guaifenesin Other (See Comments)    Lightheadedness.  Pt states has had since then and states he had no reaction.  He would like to have this med removed from chart.  . Morphine Nausea And Vomiting and Hives  . Morphine And Related Hives and Nausea And Vomiting  . Mucinex [Guaifenesin Er] Other (See Comments)    Light headed  . Penicillins Nausea And Vomiting    Objective:  General: Alert and oriented x3 in no acute distress  Dermatology: Keratotic lesion present measuring less than 0.3 cm with no skin lines  transversing the lesion, pain is present with medial lateral pressure to the lesion right submet 1, mild reactive calluses submet 5 and styloid process bilateral no capillaries with pin point bleeding noted, no webspace macerations, no ecchymosis bilateral, all nails x 10 are well manicured.  Vascular: Dorsalis Pedis and Posterior Tibial pedal pulses 2/4, Capillary Fill Time 3 seconds, + pedal hair growth bilateral, no edema bilateral lower extremities, Temperature gradient within normal limits.  Neurology: Gross sensation intact via light touch bilateral.  Musculoskeletal: Mild tenderness with palpation at the lesion site on Right>Left, Muscular strength 5/5 in all groups without pain or limitation on range of motion.  Prominent first and fifth metatarsal head and styloid fracture noted bilateral  Assessment and Plan: Problem List Items Addressed This Visit   None   Visit Diagnoses    Verruca plantaris    -  Primary   Porokeratosis       Right foot pain          -Complete examination performed -Discussed treatment options for wart -Parred keratoic warty lesion using a chisel blade x1 submet 1 on right; treated the area with Catharidin covered with bandaid; Advised patient of blistering reaction that will occur from application of medication and once this happens replace bandaid with neosporin and tape/bandaid -Recommend good supportive shoes and for foot type  -Patient to return to office as scheduled or sooner if condition worsens.  Asencion Islam, DPM

## 2020-03-10 ENCOUNTER — Ambulatory Visit: Payer: Managed Care, Other (non HMO) | Admitting: Sports Medicine

## 2020-04-09 ENCOUNTER — Encounter: Payer: Self-pay | Admitting: Sports Medicine

## 2020-04-09 ENCOUNTER — Ambulatory Visit (INDEPENDENT_AMBULATORY_CARE_PROVIDER_SITE_OTHER): Payer: Managed Care, Other (non HMO) | Admitting: Sports Medicine

## 2020-04-09 ENCOUNTER — Other Ambulatory Visit: Payer: Self-pay

## 2020-04-09 DIAGNOSIS — M79671 Pain in right foot: Secondary | ICD-10-CM

## 2020-04-09 DIAGNOSIS — B070 Plantar wart: Secondary | ICD-10-CM

## 2020-04-09 DIAGNOSIS — Q828 Other specified congenital malformations of skin: Secondary | ICD-10-CM

## 2020-04-09 NOTE — Patient Instructions (Signed)
Vinegar soaks 1 cup of white distilled vinegar to 8 cups of warm water.  Soak 20 mins. May repeat soak two times per week.  If there is thickness to nails may file nails after soaks or after bath/shower with nail file and apply tea tree oil or vicks. Apply oil daily to nails after filing for the best result.

## 2020-04-09 NOTE — Progress Notes (Signed)
Subjective: Todd Powers is a 50 y.o. male patient who returns to office for follow-up evaluation of painful wart on the bottom right foot.  Patient reports that the wart is now gone and there is no pain did get peeling of the skin after last visit. Patient denies any other pedal complaints.   Patient Active Problem List   Diagnosis Date Noted  . Lumbar stenosis with neurogenic claudication 03/06/2017  . Acute sinusitis 06/22/2016  . 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 Allergic rhinoconjunctivitis 12/28/2015    Current Outpatient Medications on File Prior to Visit  Medication Sig Dispense Refill  . cetirizine (ZYRTEC) 10 MG tablet Take 10 mg by mouth daily.    Marland Kitchen doxycycline (VIBRAMYCIN) 100 MG capsule Take 100 mg by mouth 2 (two) times daily.    Marland Kitchen lisinopril-hydrochlorothiazide (PRINZIDE,ZESTORETIC) 10-12.5 MG tablet     . Multiple Vitamins-Minerals (MULTIVITAMIN WITH MINERALS) tablet Take 1 tablet by mouth daily.    . nabumetone (RELAFEN) 750 MG tablet     . omeprazole (PRILOSEC) 20 MG capsule     . phentermine (ADIPEX-P) 37.5 MG tablet Take by mouth.    . Red Yeast Rice Extract (RED YEAST RICE PO) Take by mouth.     No current facility-administered medications on file prior to visit.    Allergies  Allergen Reactions  . Diphenhydramine Other (See Comments)    Other Reaction: ???Rash  . Armoracia Rusticana Ext (Horseradish) Nausea And Vomiting  . Guaifenesin Other (See Comments)    Lightheadedness.  Pt states has had since then and states he had no reaction.  He would like to have this med removed from chart.  . Morphine Nausea And Vomiting and Hives  . Morphine And Related Hives and Nausea And Vomiting  . Mucinex [Guaifenesin Er] Other (See Comments)    Light headed  . Penicillins Nausea And Vomiting    Objective:  General: Alert and oriented x3 in no acute distress  Dermatology: Keratotic lesion resolved with no capillaries right submet 1, mild reactive calluses submet 5  and styloid process bilateral no capillaries with pin point bleeding noted, no webspace macerations, no ecchymosis bilateral, all nails x 10 are well manicured.  Vascular: Dorsalis Pedis and Posterior Tibial pedal pulses 2/4, Capillary Fill Time 3 seconds, + pedal hair growth bilateral, no edema bilateral lower extremities, Temperature gradient within normal limits.  Neurology: Michaell Cowing sensation intact via light touch bilateral.  Musculoskeletal: No tenderness to palpation right submit 1 at the area of previous wart, Muscular strength 5/5 in all groups without pain or limitation on range of motion.  Prominent first and fifth metatarsal head and styloid fracture noted bilateral  Assessment and Plan: Problem List Items Addressed This Visit   None   Visit Diagnoses    Verruca plantaris    -  Primary   Porokeratosis       Right foot pain          -Complete examination performed -Mechanically debrided keratotic skin using a 15 blade without incident, wart resolved -Applied Salinocaine and Band-Aid -Patient to return to office as needed or sooner if condition worsens.  Asencion Islam, DPM

## 2020-08-18 DIAGNOSIS — Z8616 Personal history of COVID-19: Secondary | ICD-10-CM

## 2020-08-18 HISTORY — DX: Personal history of COVID-19: Z86.16

## 2020-11-12 ENCOUNTER — Other Ambulatory Visit: Payer: Self-pay | Admitting: Urology

## 2021-01-11 ENCOUNTER — Encounter (HOSPITAL_BASED_OUTPATIENT_CLINIC_OR_DEPARTMENT_OTHER): Payer: Self-pay | Admitting: Urology

## 2021-01-13 ENCOUNTER — Encounter (HOSPITAL_BASED_OUTPATIENT_CLINIC_OR_DEPARTMENT_OTHER): Payer: Self-pay | Admitting: Urology

## 2021-01-13 ENCOUNTER — Other Ambulatory Visit: Payer: Self-pay

## 2021-01-13 NOTE — Progress Notes (Signed)
Spoke w/ via phone for pre-op interview--- pt Lab needs dos----   [employer] and ekg            Lab results------ no COVID test -----patient states asymptomatic no test needed Arrive at ------- 0645 on 01-18-2021 NPO after MN NO Solid Food.  Clear liquids from MN until--- 0545 Med rec completed Medications to take morning of surgery ----- prilosec, zyrtec Diabetic medication ----- n/a Patient instructed no nail polish to be worn day of surgery Patient instructed to bring photo id and insurance card day of surgery Patient aware to have Driver (ride ) / caregiver  for 24 hours after surgery --wife, karla Patient Special Instructions ----- n/a Pre-Op special Istructions ----- per pt no bp/ IV left arm due to hx venous thoracic outlet syndrome with DVT. Patient verbalized understanding of instructions that were given at this phone interview. Patient denies shortness of breath, chest pain, fever, cough at this phone interview.

## 2021-01-14 ENCOUNTER — Encounter (HOSPITAL_BASED_OUTPATIENT_CLINIC_OR_DEPARTMENT_OTHER): Payer: Self-pay | Admitting: Urology

## 2021-01-18 ENCOUNTER — Encounter (HOSPITAL_BASED_OUTPATIENT_CLINIC_OR_DEPARTMENT_OTHER): Payer: Self-pay | Admitting: Urology

## 2021-01-18 ENCOUNTER — Encounter (HOSPITAL_BASED_OUTPATIENT_CLINIC_OR_DEPARTMENT_OTHER)
Admission: RE | Disposition: A | Discharge status: Home / Self Care | Payer: Self-pay | Source: Home / Self Care | Attending: Urology

## 2021-01-18 ENCOUNTER — Ambulatory Visit (HOSPITAL_BASED_OUTPATIENT_CLINIC_OR_DEPARTMENT_OTHER): Payer: Managed Care, Other (non HMO) | Admitting: Anesthesiology

## 2021-01-18 ENCOUNTER — Other Ambulatory Visit: Payer: Self-pay

## 2021-01-18 ENCOUNTER — Ambulatory Visit (HOSPITAL_BASED_OUTPATIENT_CLINIC_OR_DEPARTMENT_OTHER)
Admission: RE | Admit: 2021-01-18 | Discharge: 2021-01-18 | Disposition: A | Discharge status: Home / Self Care | Payer: Managed Care, Other (non HMO) | Attending: Urology | Admitting: Urology

## 2021-01-18 DIAGNOSIS — N433 Hydrocele, unspecified: Secondary | ICD-10-CM | POA: Insufficient documentation

## 2021-01-18 DIAGNOSIS — Z79899 Other long term (current) drug therapy: Secondary | ICD-10-CM | POA: Diagnosis not present

## 2021-01-18 DIAGNOSIS — Z791 Long term (current) use of non-steroidal anti-inflammatories (NSAID): Secondary | ICD-10-CM | POA: Diagnosis not present

## 2021-01-18 DIAGNOSIS — I10 Essential (primary) hypertension: Secondary | ICD-10-CM | POA: Diagnosis not present

## 2021-01-18 DIAGNOSIS — Z880 Allergy status to penicillin: Secondary | ICD-10-CM | POA: Insufficient documentation

## 2021-01-18 DIAGNOSIS — K219 Gastro-esophageal reflux disease without esophagitis: Secondary | ICD-10-CM | POA: Insufficient documentation

## 2021-01-18 HISTORY — DX: Hydrocele, unspecified: N43.3

## 2021-01-18 HISTORY — DX: Spondylosis without myelopathy or radiculopathy, site unspecified: M47.819

## 2021-01-18 HISTORY — DX: Diaphragmatic hernia without obstruction or gangrene: K44.9

## 2021-01-18 HISTORY — PX: HYDROCELE EXCISION: SHX482

## 2021-01-18 LAB — POCT I-STAT, CHEM 8
BUN: 27 mg/dL — ABNORMAL HIGH (ref 6–20)
BUN: 19 mg/dL (ref 6–20)
Calcium, Ion: 1.17 mmol/L (ref 1.15–1.40)
Calcium, Ion: 1.21 mmol/L (ref 1.15–1.40)
Chloride: 103 mmol/L (ref 98–111)
Chloride: 104 mmol/L (ref 98–111)
Creatinine, Ser: 1.1 mg/dL (ref 0.61–1.24)
Creatinine, Ser: 1.1 mg/dL (ref 0.61–1.24)
Glucose, Bld: 85 mg/dL (ref 70–99)
Glucose, Bld: 95 mg/dL (ref 70–99)
HCT: 44 % (ref 39.0–52.0)
HCT: 42 % (ref 39.0–52.0)
Hemoglobin: 15 g/dL (ref 13.0–17.0)
Hemoglobin: 14.3 g/dL (ref 13.0–17.0)
Potassium: 6.5 mmol/L (ref 3.5–5.1)
Potassium: 4.1 mmol/L (ref 3.5–5.1)
Sodium: 139 mmol/L (ref 135–145)
Sodium: 140 mmol/L (ref 135–145)
TCO2: 28 mmol/L (ref 22–32)
TCO2: 24 mmol/L (ref 22–32)

## 2021-01-18 SURGERY — HYDROCELECTOMY
Anesthesia: General | Laterality: Right

## 2021-01-18 MED ORDER — ROCURONIUM BROMIDE 100 MG/10ML IV SOLN
INTRAVENOUS | Status: DC | PRN
  Administered 2021-01-18: 60 mg via INTRAVENOUS

## 2021-01-18 MED ORDER — AMISULPRIDE (ANTIEMETIC) 5 MG/2ML IV SOLN
10.0000 mg | Freq: Once | INTRAVENOUS | Status: DC | PRN
Start: 2021-01-18 — End: 2021-01-18

## 2021-01-18 MED ORDER — CLINDAMYCIN PHOSPHATE 900 MG/50ML IV SOLN
900.0000 mg | INTRAVENOUS | Status: AC
  Administered 2021-01-18: 900 mg via INTRAVENOUS

## 2021-01-18 MED ORDER — FENTANYL CITRATE (PF) 100 MCG/2ML IJ SOLN
INTRAMUSCULAR | Status: AC
  Filled 2021-01-18: qty 2

## 2021-01-18 MED ORDER — ONDANSETRON HCL 4 MG/2ML IJ SOLN
INTRAMUSCULAR | Status: DC | PRN
  Administered 2021-01-18: 4 mg via INTRAVENOUS

## 2021-01-18 MED ORDER — DEXAMETHASONE SODIUM PHOSPHATE 10 MG/ML IJ SOLN
INTRAMUSCULAR | Status: AC
  Filled 2021-01-18: qty 1

## 2021-01-18 MED ORDER — DEXAMETHASONE SODIUM PHOSPHATE 4 MG/ML IJ SOLN
INTRAMUSCULAR | Status: DC | PRN
  Administered 2021-01-18: 10 mg via INTRAVENOUS

## 2021-01-18 MED ORDER — CLINDAMYCIN PHOSPHATE 900 MG/50ML IV SOLN
INTRAVENOUS | Status: AC
  Filled 2021-01-18: qty 50

## 2021-01-18 MED ORDER — ACETAMINOPHEN 500 MG PO TABS
ORAL_TABLET | ORAL | Status: AC
  Filled 2021-01-18: qty 2

## 2021-01-18 MED ORDER — FAMOTIDINE IN NACL 20-0.9 MG/50ML-% IV SOLN
INTRAVENOUS | Status: AC
  Filled 2021-01-18: qty 50

## 2021-01-18 MED ORDER — ACETAMINOPHEN 500 MG PO TABS
1000.0000 mg | ORAL_TABLET | Freq: Once | ORAL | Status: AC
  Administered 2021-01-18: 1000 mg via ORAL

## 2021-01-18 MED ORDER — SUGAMMADEX SODIUM 200 MG/2ML IV SOLN
INTRAVENOUS | Status: DC | PRN
  Administered 2021-01-18: 200 mg via INTRAVENOUS

## 2021-01-18 MED ORDER — PROPOFOL 10 MG/ML IV BOLUS
INTRAVENOUS | Status: AC
  Filled 2021-01-18: qty 40

## 2021-01-18 MED ORDER — MIDAZOLAM HCL 2 MG/2ML IJ SOLN
INTRAMUSCULAR | Status: AC
  Filled 2021-01-18: qty 2

## 2021-01-18 MED ORDER — LACTATED RINGERS IV SOLN: INTRAVENOUS | Status: DC

## 2021-01-18 MED ORDER — KETOROLAC TROMETHAMINE 30 MG/ML IJ SOLN
INTRAMUSCULAR | Status: AC
  Filled 2021-01-18: qty 1

## 2021-01-18 MED ORDER — PROPOFOL 10 MG/ML IV BOLUS
INTRAVENOUS | Status: DC | PRN
Start: 2021-01-18 — End: 2021-01-18
  Administered 2021-01-18: 250 mg via INTRAVENOUS

## 2021-01-18 MED ORDER — SCOPOLAMINE 1 MG/3DAYS TD PT72
MEDICATED_PATCH | TRANSDERMAL | Status: AC
  Filled 2021-01-18: qty 1

## 2021-01-18 MED ORDER — LIDOCAINE 2% (20 MG/ML) 5 ML SYRINGE
INTRAMUSCULAR | Status: AC
  Filled 2021-01-18: qty 5

## 2021-01-18 MED ORDER — SUCCINYLCHOLINE CHLORIDE 200 MG/10ML IV SOSY
PREFILLED_SYRINGE | INTRAVENOUS | Status: DC | PRN
  Administered 2021-01-18: 140 mg via INTRAVENOUS

## 2021-01-18 MED ORDER — FENTANYL CITRATE (PF) 100 MCG/2ML IJ SOLN: 25.0000 ug | INTRAMUSCULAR | Status: DC | PRN

## 2021-01-18 MED ORDER — LIDOCAINE HCL (CARDIAC) PF 100 MG/5ML IV SOSY
PREFILLED_SYRINGE | INTRAVENOUS | Status: DC | PRN
  Administered 2021-01-18: 100 mg via INTRAVENOUS

## 2021-01-18 MED ORDER — ROCURONIUM BROMIDE 10 MG/ML (PF) SYRINGE
PREFILLED_SYRINGE | INTRAVENOUS | Status: AC
  Filled 2021-01-18: qty 10

## 2021-01-18 MED ORDER — PROPOFOL 500 MG/50ML IV EMUL
INTRAVENOUS | Status: DC | PRN
  Administered 2021-01-18: 150 ug/kg/min via INTRAVENOUS

## 2021-01-18 MED ORDER — MIDAZOLAM HCL 5 MG/5ML IJ SOLN
INTRAMUSCULAR | Status: DC | PRN
  Administered 2021-01-18: 2 mg via INTRAVENOUS

## 2021-01-18 MED ORDER — KETOROLAC TROMETHAMINE 30 MG/ML IJ SOLN
INTRAMUSCULAR | Status: DC | PRN
  Administered 2021-01-18: 30 mg via INTRAVENOUS

## 2021-01-18 MED ORDER — SUCCINYLCHOLINE CHLORIDE 200 MG/10ML IV SOSY
PREFILLED_SYRINGE | INTRAVENOUS | Status: AC
  Filled 2021-01-18: qty 10

## 2021-01-18 MED ORDER — FAMOTIDINE IN NACL 20-0.9 MG/50ML-% IV SOLN
20.0000 mg | Freq: Once | INTRAVENOUS | Status: AC
  Administered 2021-01-18: 20 mg via INTRAVENOUS

## 2021-01-18 MED ORDER — FENTANYL CITRATE (PF) 100 MCG/2ML IJ SOLN
INTRAMUSCULAR | Status: DC | PRN
  Administered 2021-01-18: 25 ug via INTRAVENOUS
  Administered 2021-01-18: 100 ug via INTRAVENOUS
  Administered 2021-01-18: 25 ug via INTRAVENOUS
  Administered 2021-01-18: 50 ug via INTRAVENOUS

## 2021-01-18 MED ORDER — HYDROCODONE-ACETAMINOPHEN 5-325 MG PO TABS: 1.0000 | ORAL_TABLET | ORAL | 0 refills | Status: AC | PRN

## 2021-01-18 MED ORDER — ONDANSETRON HCL 4 MG/2ML IJ SOLN
INTRAMUSCULAR | Status: AC
  Filled 2021-01-18: qty 2

## 2021-01-18 MED ORDER — BUPIVACAINE HCL (PF) 0.25 % IJ SOLN
INTRAMUSCULAR | Status: DC | PRN
  Administered 2021-01-18: 10 mL

## 2021-01-18 MED ORDER — SCOPOLAMINE 1 MG/3DAYS TD PT72
1.0000 | MEDICATED_PATCH | TRANSDERMAL | Status: DC
  Administered 2021-01-18: 1.5 mg via TRANSDERMAL

## 2021-01-18 SURGICAL SUPPLY — 43 items
ADH SKN CLS APL DERMABOND .7 (GAUZE/BANDAGES/DRESSINGS) ×1
BLADE CLIPPER SENSICLIP SURGIC (BLADE) IMPLANT
BLADE SURG 15 STRL LF DISP TIS (BLADE) ×1 IMPLANT
BLADE SURG 15 STRL SS (BLADE) ×2
BNDG GAUZE ELAST 4 BULKY (GAUZE/BANDAGES/DRESSINGS) ×2 IMPLANT
COVER BACK TABLE 60X90IN (DRAPES) ×2 IMPLANT
COVER MAYO STAND STRL (DRAPES) ×2 IMPLANT
DERMABOND ADVANCED (GAUZE/BANDAGES/DRESSINGS) ×1
DERMABOND ADVANCED .7 DNX12 (GAUZE/BANDAGES/DRESSINGS) ×1 IMPLANT
DISSECTOR ROUND CHERRY 3/8 STR (MISCELLANEOUS) IMPLANT
DRAIN PENROSE 0.25X18 (DRAIN) ×2 IMPLANT
DRAPE LAPAROTOMY 100X72 PEDS (DRAPES) ×2 IMPLANT
DRSG TEGADERM 4X4.75 (GAUZE/BANDAGES/DRESSINGS) IMPLANT
ELECT REM PT RETURN 9FT ADLT (ELECTROSURGICAL) ×2
ELECTRODE REM PT RTRN 9FT ADLT (ELECTROSURGICAL) ×1 IMPLANT
GAUZE 4X4 16PLY ~~LOC~~+RFID DBL (SPONGE) ×2 IMPLANT
GAUZE SPONGE 4X4 12PLY STRL LF (GAUZE/BANDAGES/DRESSINGS) ×2 IMPLANT
GLOVE SURG ENC MOIS LTX SZ6.5 (GLOVE) ×2 IMPLANT
GOWN STRL REUS W/TWL LRG LVL3 (GOWN DISPOSABLE) ×2 IMPLANT
HEMOSTAT ARISTA ABSORB 3G PWDR (HEMOSTASIS) ×2 IMPLANT
KIT TURNOVER CYSTO (KITS) ×2 IMPLANT
NEEDLE HYPO 25X1 1.5 SAFETY (NEEDLE) ×2 IMPLANT
NS IRRIG 500ML POUR BTL (IV SOLUTION) IMPLANT
PACK BASIN DAY SURGERY FS (CUSTOM PROCEDURE TRAY) ×2 IMPLANT
PANTS MESH DISP LRG (UNDERPADS AND DIAPERS) ×1 IMPLANT
PANTS MESH DISPOSABLE L (UNDERPADS AND DIAPERS) ×1
PENCIL SMOKE EVACUATOR (MISCELLANEOUS) ×2 IMPLANT
SUT CHROMIC 3 0 PS 2 (SUTURE) ×2 IMPLANT
SUT ETHILON 4 0 PS 2 18 (SUTURE) ×2 IMPLANT
SUT MNCRL AB 4-0 PS2 18 (SUTURE) ×2 IMPLANT
SUT VIC AB 2-0 SH 27 (SUTURE)
SUT VIC AB 2-0 SH 27XBRD (SUTURE) IMPLANT
SUT VIC AB 3-0 SH 27 (SUTURE) ×2
SUT VIC AB 3-0 SH 27X BRD (SUTURE) IMPLANT
SUT VIC AB 3-0 SH 27XBRD (SUTURE) ×1 IMPLANT
SUT VIC AB 4-0 SH 27 (SUTURE) ×2
SUT VIC AB 4-0 SH 27XANBCTRL (SUTURE) ×1 IMPLANT
SYR CONTROL 10ML LL (SYRINGE) ×2 IMPLANT
TOWEL OR 17X26 10 PK STRL BLUE (TOWEL DISPOSABLE) ×4 IMPLANT
TRAY DSU PREP LF (CUSTOM PROCEDURE TRAY) ×2 IMPLANT
TUBE CONNECTING 12X1/4 (SUCTIONS) ×2 IMPLANT
WATER STERILE IRR 500ML POUR (IV SOLUTION) IMPLANT
YANKAUER SUCT BULB TIP NO VENT (SUCTIONS) ×2 IMPLANT

## 2021-01-18 NOTE — Discharge Instructions (Addendum)
Scrotal surgery postoperative instructions  Wound:  In most cases your incision will have absorbable sutures that will dissolve within the first 10-20 days. Some will fall out even earlier. Expect some redness as the sutures dissolved but this should occur only around the sutures. If there is generalized redness, especially with increasing pain or swelling, let us know. The scrotum will very likely get "black and blue" as the blood in the tissues spread. Sometimes the whole scrotum will turn colors. The black and blue is followed by a yellow and brown color. In time, all the discoloration will go away. In some cases some firm swelling in the area of the testicle may persist for up to 4-6 weeks after the surgery and is considered normal in most cases.  Diet:  You may return to your normal diet within 24 hours following your surgery. You may note some mild nausea and possibly vomiting the first 6-8 hours following surgery. This is usually due to the side effects of anesthesia, and will disappear quite soon. I would suggest clear liquids and a very light meal the first evening following your surgery.  Activity:  Your physical activity should be restricted the first 48 hours. During that time you should remain relatively inactive, moving about only when necessary. During the first 7-10 days following surgery he should avoid lifting any heavy objects (anything greater than 15 pounds), and avoid strenuous exercise. If you work, ask [place] specifically about your restrictions, both for work and home. We will write a note to your employer if needed.  You should plan to wear a tight pair of jockey shorts or an athletic supporter for the first 4-5 days, even to sleep. This will keep the scrotum immobilized to some degree and keep the swelling down.  Ice packs should be placed on and off over the scrotum for the first 48 hours. Frozen peas or corn in a ZipLock bag can be frozen, used and re-frozen. Fifteen minutes  on and 15 minutes off is a reasonable schedule. The ice is a good pain reliever and keeps the swelling down.  Hygiene:  You may shower 48 hours after your surgery. Tub bathing should be restricted until the seventh day.          Medication:  You will be sent home with some type of pain medication. In many cases you will be sent home with a narcotic pain pill (hydrococone or oxycodone). If the pain is not too bad, you may take either Tylenol (acetaminophen) or Advil (ibuprofen) which contain no narcotic agents, and might be tolerated a little better, with fewer side effects. If the pain medication you are sent home with does not control the pain, you will have to let us know. Some narcotic pain medications cannot be given or refilled by a phone call to a pharmacy.  Problems you should report to [place]:  Fever of 101.0 degrees Fahrenheit or greater. Moderate or severe swelling under the skin incision or involving the scrotum. Drug reaction such as hives, a rash, nausea or vomiting.     Post Anesthesia Home Care Instructions  Activity: Get plenty of rest for the remainder of the day. A responsible individual must stay with you for 24 hours following the procedure.  For the next 24 hours, DO NOT: -Drive a car -Advertising copywriter -Drink alcoholic beverages -Take any medication unless instructed by your physician -Make any legal decisions or sign important papers.  Meals: Start with liquid foods such as gelatin or soup.  Progress to regular foods as tolerated. Avoid greasy, spicy, heavy foods. If nausea and/or vomiting occur, drink only clear liquids until the nausea and/or vomiting subsides. Call your physician if vomiting continues.  Special Instructions/Symptoms: Your throat may feel dry or sore from the anesthesia or the breathing tube placed in your throat during surgery. If this causes discomfort, gargle with warm salt water. The discomfort should disappear within 24 hours.  If  you had a scopolamine patch placed behind your ear for the management of post- operative nausea and/or vomiting:  1. The medication in the patch is effective for 72 hours, after which it should be removed.  Wrap patch in a tissue and discard in the trash. Wash hands thoroughly with soap and water. 2. You may remove the patch earlier than 72 hours if you experience unpleasant side effects which may include dry mouth, dizziness or visual disturbances. 3. Avoid touching the patch. Wash your hands with soap and water after contact with the patch.  Remove patch behind right ear by Friday, January 21, 2021.  Begin taking Tylenol at 2:30 PM as needed for pain.  May take Ibuprofen, Advil, Motrin, or Aleve starting at 3:330 as needed for pain.

## 2021-01-18 NOTE — Interval H&P Note (Signed)
History and Physical Interval Note:  01/18/2021 8:14 AM  Todd Powers  has presented today for surgery, with the diagnosis of RIGHT HYDROCELE.  The various methods of treatment have been discussed with the patient and family. After consideration of risks, benefits and other options for treatment, the patient has consented to  Procedure(s): HYDROCELECTOMY ADULT (Right) as a surgical intervention.  The patient's history has been reviewed, patient examined, no change in status, stable for surgery.  I have reviewed the patient's chart and labs.  Questions were answered to the patient's satisfaction.     Kadi Hession D Yousof Alderman

## 2021-01-18 NOTE — Anesthesia Postprocedure Evaluation (Signed)
Anesthesia Post Note  Patient: Gwenette Greet  Procedure(s) Performed: HYDROCELECTOMY ADULT (Right)     Patient location during evaluation: PACU Anesthesia Type: General Level of consciousness: awake and alert Pain management: pain level controlled Vital Signs Assessment: post-procedure vital signs reviewed and stable Respiratory status: spontaneous breathing, nonlabored ventilation, respiratory function stable and patient connected to nasal cannula oxygen Cardiovascular status: blood pressure returned to baseline and stable Postop Assessment: no apparent nausea or vomiting Anesthetic complications: no   No notable events documented.  Last Vitals:  Vitals:   01/18/21 1045 01/18/21 1120  BP: 127/82 112/78  Pulse: 84 79  Resp: (!) 25 18  Temp:  36.6 C  SpO2: 97% 98%    Last Pain:  Vitals:   01/18/21 1120  TempSrc:   PainSc: 0-No pain                 Kennieth Rad

## 2021-01-18 NOTE — Anesthesia Procedure Notes (Signed)
Procedure Name: Intubation Date/Time: 01/18/2021 8:49 AM Performed by: Justice Rocher, CRNA Pre-anesthesia Checklist: Patient identified, Emergency Drugs available, Suction available, Patient being monitored and Timeout performed Patient Re-evaluated:Patient Re-evaluated prior to induction Oxygen Delivery Method: Circle system utilized Preoxygenation: Pre-oxygenation with 100% oxygen Induction Type: IV induction Ventilation: Mask ventilation without difficulty Laryngoscope Size: Mac and 4 Grade View: Grade II Tube type: Oral Tube size: 7.5 mm Number of attempts: 1 Airway Equipment and Method: Stylet and Oral airway Placement Confirmation: ETT inserted through vocal cords under direct vision, positive ETCO2, breath sounds checked- equal and bilateral and CO2 detector Secured at: 22 cm Tube secured with: Tape Dental Injury: Teeth and Oropharynx as per pre-operative assessment

## 2021-01-18 NOTE — Transfer of Care (Signed)
Immediate Anesthesia Transfer of Care Note  Patient: Todd Powers  Procedure(s) Performed: Procedure(s) (LRB): HYDROCELECTOMY ADULT (Right)  Patient Location: PACU  Anesthesia Type: General  Level of Consciousness: awake, sedated, patient cooperative and responds to stimulation  Airway & Oxygen Therapy: Patient Spontanous Breathing and Patient connected to [place] 02 and soft FM   Post-op Assessment: Report given to PACU RN, Post -op Vital signs reviewed and stable and Patient moving all extremities  Post vital signs: Reviewed and stable  Complications: No apparent anesthesia complications

## 2021-01-18 NOTE — H&P (Signed)
CC/HPI: cc: Swollen right testicle   06/03/20: 50 year old man who presented to his PCP with a swollen right testicle after having sexual intercourse. He saw Dr. Nila Nephew in [place] who initially put him on tamsulosin doxycycline. He returned on 03/03 and office scrotal ultrasound showed hydrocele. He was told to stop the doxycycline and start Bactrim for 10 days. He has not noticed any change in size of right scrotum since then. He denies any urinary symptoms including hematuria, dysuria or frequency. He has occasional mild ache on the right side but is not very painful. He has a family history of prostate cancer in his paternal grandfather. He was treated at age 89 with an orchiectomy. His father died 73 years ago.   2021-01-10: 50 yo man with right hydrocele here for discussion of surgery. Patient was last seen in March 2022 and at that time had a scrotal ultrasound confirming a right-sided hydrocele. He is scheduled for surgery on 01/18/2021.     ALLERGIES: Penicillin    MEDICATIONS: Hydrochlorothiazide 12.5 mg tablet  Lisinopril 20 mg tablet  Omeprazole  Zyrtec  Fiber  Mucinex  Multivitamin  Nabumetone 750 mg tablet  Ozempic 2 mg/0.75 ml (8 mg/3 ml) pen injector  Phentermine Hcl 37.5 mg tablet  Pravastatin Sodium 40 mg tablet  Red Yeast Rice     GU PSH: None   NON-GU PSH: None   GU PMH: Family Hx of Prostate Cancer, DRE today within normal limits. Will send patient to lab today for baseline PSA checked. Recommend checking annually from age 27 to early 41s based on family history. - 06/03/2020 Hydrocele, Discussed scrotal ultrasound findings which confirmed a moderate right hydrocele. Management options include observation, aspiration, and surgical excision with hydrocelectomy. We discussed the risks and benefits of a right hydrocelectomy including but not limited to recurrence, pain, hematoma, bleeding, damage to surrounding structures, infection, need for future treatment. Patient  understands these risks and wishes to proceed with surgery. He will be called with the next surgery date according to his schedule. - 06/03/2020    NON-GU PMH: None   FAMILY HISTORY: None   SOCIAL HISTORY: None   REVIEW OF SYSTEMS:    GU Review Male:   Patient denies frequent urination, hard to postpone urination, burning/ pain with urination, get up at night to urinate, leakage of urine, stream starts and stops, trouble starting your stream, have to strain to urinate , erection problems, and penile pain.  Gastrointestinal (Upper):   Patient denies nausea, vomiting, and indigestion/ heartburn.  Gastrointestinal (Lower):   Patient denies diarrhea and constipation.  Constitutional:   Patient denies fever, night sweats, weight loss, and fatigue.  Skin:   Patient denies skin rash/ lesion and itching.  Eyes:   Patient denies blurred vision and double vision.  Ears/ Nose/ Throat:   Patient denies sore throat and sinus problems.  Hematologic/Lymphatic:   Patient denies swollen glands and easy bruising.  Cardiovascular:   Patient denies leg swelling and chest pains.  Respiratory:   Patient denies cough and shortness of breath.  Endocrine:   Patient denies excessive thirst.  Musculoskeletal:   Patient denies back pain and joint pain.  Neurological:   Patient denies dizziness and headaches.  Psychologic:   Patient denies depression and anxiety.   VITAL SIGNS:      01/10/2021 08:56 AM  Weight 275 lb / 124.74 kg  Height 75 in / 190.5 cm  BP 140/86 mmHg  Pulse 80 /min  Temperature 97.1 F / 36.1 C  BMI 34.4 kg/m   GU PHYSICAL EXAMINATION:    Testes: Large scrotum consistent with hydrocele seen on ultrasound, unable to palpate testes due to size of hydrocele  Urethral Meatus: Normal size. No lesion, no wart, no discharge, no polyp. Normal location.  Penis: Circumcised, no warts, no cracks. No dorsal Peyronie's plaques, no left corporal Peyronie's plaques, no right corporal Peyronie's plaques,  no scarring, no warts. No balanitis, no meatal stenosis.   MULTI-SYSTEM PHYSICAL EXAMINATION:    Constitutional: Well-nourished. No physical deformities. Normally developed. Good grooming.  Neck: Neck symmetrical, not swollen. Normal tracheal position.  Respiratory: No labored breathing, no use of accessory muscles.   Skin: No paleness, no jaundice, no cyanosis. No lesion, no ulcer, no rash.  Neurologic / Psychiatric: Oriented to time, oriented to place, oriented to person. No depression, no anxiety, no agitation.  Gastrointestinal: No rigidity  Eyes: Normal conjunctivae. Normal eyelids.  Ears, Nose, Mouth, and Throat: Left ear no scars, no lesions, no masses. Right ear no scars, no lesions, no masses. Nose no scars, no lesions, no masses. Normal hearing. Normal lips.  Musculoskeletal: Normal gait and station of head and neck.     Complexity of Data:  Records Review:   Previous Patient Records, POC Tool  Urine Test Review:   Urinalysis  X-Ray Review: Scrotal Ultrasound: Reviewed Films. Discussed With Patient.     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 These include but not limited to bleeding, infection, recurrence, hematoma, pain, damage to surrounding structures, need for repeat procedure or additional treatment. We discussed how the procedures performed  as well as postoperative limitations including not lifting more than 10 lb for approximately 4 weeks/no strenuous activity. Pt scheduled for surgery on 01/18/21.

## 2021-01-18 NOTE — Op Note (Addendum)
PATIENT:  Todd Powers  PRE-OPERATIVE DIAGNOSIS: right hydrocele  POST-OPERATIVE DIAGNOSIS:  Same  PROCEDURE:  Procedure(s): Right hydrocelectomy  SURGEON:  Kasandra Knudsen, MD  INDICATION:   ANESTHESIA:  General  EBL:  Minimal  DRAINS: None  LOCAL MEDICATIONS USED:  None  SPECIMEN:  None  DISPOSITION OF SPECIMEN:  N/A  Description of procedure: After informed consent the patient was brought to the major OR, placed on the table and administered general anesthesia. His genitalia was then sterilely prepped and draped. An official timeout was then performed.  A right transevere scrotal incision was then made and carried down over the right hydrocele. The tissue over the hydrocele was cleared using a combination of sharp and blunt technique. The hydrocele and testis were delivered into the field. The hydrocele was then opened, drained of clear amber fluid (350cc). The excess hydrocele sac tissue was excised with the Bovie electrocautery and then I cauterized the edges. I then reapproximated the edges posteriorly behind the epididymis with a running vicryl suture. The appendix testis was removed with the Bovie.   His testicle was then replaced in the normal anatomic position and his right hemiscrotum. I then closed the deep scrotal tissue with running 3-0 vicryl suture. I injected quarter percent Marcaine in the subcutaneous tissue and closed the skin with running 3-0 chromic. Neosporin, a sterile gauze dressing, fluff Kerlix and a scrotal support were applied. The patient tolerated the procedure well no intraoperative complications. Needle sponge and instrument counts were correct at the end of the operation.   PLAN OF CARE: Discharge to home after PACU  PATIENT DISPOSITION:  PACU - hemodynamically stable.

## 2021-01-18 NOTE — Anesthesia Preprocedure Evaluation (Addendum)
Anesthesia Evaluation  Patient identified by MRN, date of birth, ID band Patient awake    Reviewed: Allergy & Precautions, NPO status , Patient's Chart, lab work & pertinent test results  History of Anesthesia Complications (+) PONV  Airway Mallampati: III  TM Distance: >3 FB Neck ROM: Full    Dental  (+) Dental Advisory Given   Pulmonary neg pulmonary ROS,    breath sounds clear to auscultation       Cardiovascular hypertension, Pt. on medications  Rhythm:Regular Rate:Normal     Neuro/Psych negative neurological ROS     GI/Hepatic Neg liver ROS, hiatal hernia, GERD  Medicated,  Endo/Other  negative endocrine ROS  Renal/GU negative Renal ROS     Musculoskeletal  (+) Arthritis ,   Abdominal   Peds  Hematology negative hematology ROS (+)   Anesthesia Other Findings   Reproductive/Obstetrics                             Anesthesia Physical Anesthesia Plan  ASA: 2  Anesthesia Plan: General   Post-op Pain Management:    Induction: Intravenous  PONV Risk Score and Plan: 4 or greater and Midazolam, Scopolamine patch - Pre-op, Propofol infusion, Dexamethasone and Ondansetron  Airway Management Planned: Oral ETT  Additional Equipment: None  Intra-op Plan:   Post-operative Plan: Extubation in OR  Informed Consent: I have reviewed the patients History and Physical, chart, labs and discussed the procedure including the risks, benefits and alternatives for the proposed anesthesia with the patient or authorized representative who has indicated his/her understanding and acceptance.     Dental advisory given  Plan Discussed with: CRNA  Anesthesia Plan Comments:        Anesthesia Quick Evaluation

## 2021-01-19 ENCOUNTER — Encounter (HOSPITAL_BASED_OUTPATIENT_CLINIC_OR_DEPARTMENT_OTHER): Payer: Self-pay | Admitting: Urology

## 2022-02-13 ENCOUNTER — Ambulatory Visit (INDEPENDENT_AMBULATORY_CARE_PROVIDER_SITE_OTHER): Payer: Managed Care, Other (non HMO)

## 2022-02-13 DIAGNOSIS — E663 Overweight: Secondary | ICD-10-CM

## 2022-02-14 ENCOUNTER — Ambulatory Visit: Payer: Managed Care, Other (non HMO)

## 2022-02-14 LAB — CMP14 + ANION GAP
ALT: 36 IU/L (ref 0–44)
AST: 33 IU/L (ref 0–40)
Albumin/Globulin Ratio: 2 (ref 1.2–2.2)
Albumin: 4.7 g/dL (ref 4.1–5.1)
Alkaline Phosphatase: 53 IU/L (ref 44–121)
Anion Gap: 16 mmol/L (ref 10.0–18.0)
BUN/Creatinine Ratio: 22 — ABNORMAL HIGH (ref 9–20)
BUN: 22 mg/dL (ref 6–24)
Bilirubin Total: 0.6 mg/dL (ref 0.0–1.2)
CO2: 24 mmol/L (ref 20–29)
Calcium: 10.1 mg/dL (ref 8.7–10.2)
Chloride: 101 mmol/L (ref 96–106)
Creatinine, Ser: 0.99 mg/dL (ref 0.76–1.27)
Globulin, Total: 2.4 g/dL (ref 1.5–4.5)
Glucose: 79 mg/dL (ref 70–99)
Potassium: 4.8 mmol/L (ref 3.5–5.2)
Sodium: 141 mmol/L (ref 134–144)
Total Protein: 7.1 g/dL (ref 6.0–8.5)
eGFR: 93 mL/min/{1.73_m2} (ref >59)

## 2022-02-14 LAB — LIPID PANEL
Chol/HDL Ratio: 4.6 ratio (ref 0.0–5.0)
Cholesterol, Total: 185 mg/dL (ref 100–199)
HDL: 40 mg/dL (ref >39)
LDL Chol Calc (NIH): 112 mg/dL — ABNORMAL HIGH (ref 0–99)
Triglycerides: 190 mg/dL — ABNORMAL HIGH (ref 0–149)
VLDL Cholesterol Cal: 33 mg/dL (ref 5–40)

## 2022-02-14 LAB — HEMOGLOBIN A1C
Est. average glucose Bld gHb Est-mCnc: 114 mg/dL
Hgb A1c MFr Bld: 5.6 % (ref 4.8–5.6)

## 2022-02-14 LAB — TSH: TSH: 2.61 u[IU]/mL (ref 0.450–4.500)

## 2022-02-27 ENCOUNTER — Other Ambulatory Visit: Payer: Self-pay

## 2022-02-27 MED ORDER — PRAVASTATIN SODIUM 40 MG PO TABS: 40.0000 mg | ORAL_TABLET | Freq: Every day | ORAL | 1 refills | Status: DC

## 2022-02-28 ENCOUNTER — Encounter: Payer: Self-pay | Admitting: Internal Medicine

## 2022-02-28 ENCOUNTER — Ambulatory Visit: Payer: Managed Care, Other (non HMO) | Admitting: Internal Medicine

## 2022-02-28 VITALS — BP 118/80 | HR 87 | Temp 98.0°F | Resp 16 | Ht 76.0 in | Wt 297.0 lb

## 2022-02-28 DIAGNOSIS — J0100 Acute maxillary sinusitis, unspecified: Secondary | ICD-10-CM

## 2022-02-28 MED ORDER — AMOXICILLIN-POT CLAVULANATE 875-125 MG PO TABS: 1.0000 | ORAL_TABLET | Freq: Two times a day (BID) | ORAL | 0 refills | Status: DC

## 2022-02-28 NOTE — Progress Notes (Signed)
Office Visit  Subjective   Patient ID: Todd Powers   DOB: [birth year]   Age: 51 y.o.   MRN: [id number]   Chief Complaint Chief Complaint  Patient presents with   Acute Visit    Sinus issues      History of Present Illness The patient is a 51 year old Caucasian/White male who presents with upper respiratory tract symptoms which began 4 weeks ago. He complains of sinus congestion with yellow nasal discharge with post nasal drip associated with sore throat and cough productive of yellow sputum.    He has no additional complaints. He denies body aches, fever, chills, shortness of breath, nausea, vomiting, diarrhea, chest congestion, or wheezing. Alleviating factors: alka selzter and mucinex nasal spray.  The patient's past medical history is notable for a history of allergies.             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,   s/p mechanical thrombectomy ballon angioplasty  and thrombolysis x2  (01-13-2021  pt stated had negative work-up for bleeding order,  and had no previous clot prior and none since 2011)   Hypertension    Right hydrocele    Scoliosis    per pt severe   Thoracic outlet syndrome 11/2009   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  HENT:  Positive for congestion, sinus pain and sore throat.   Eyes:  Positive for discharge. Negative for blurred vision and double vision.  Respiratory:  Positive for cough and sputum production. Negative for shortness of breath and wheezing.   Cardiovascular:  Negative for chest pain, palpitations and leg swelling.  Gastrointestinal:  Negative for constipation, diarrhea, nausea and vomiting.  Musculoskeletal:  Negative for myalgias.  Skin:  Negative for itching and rash.  Neurological:  Negative for dizziness, weakness and headaches.       Objective:    Vitals BP 118/80   Pulse 87   Temp 98 F (36.7 C)   Resp 16   Ht 6\' 4"  (1.93 m)   Wt 297 lb (134.7 kg)   SpO2 96%   BMI 36.15 kg/m    Physical Examination Physical Exam Constitutional:      Appearance: Normal appearance.  HENT:     Right Ear: Tympanic membrane, ear canal and external ear normal.     Left Ear: Tympanic membrane, ear canal and external ear normal.  Cardiovascular:     Rate and Rhythm: Normal rate and regular rhythm.     Pulses: Normal pulses.     Heart sounds: Normal heart sounds. No murmur heard.    No friction rub. No gallop.  Pulmonary:     Effort: Pulmonary effort is normal. No respiratory distress.     Breath sounds: Normal breath sounds. No wheezing, rhonchi or rales.  Abdominal:     General: Abdomen is flat. Bowel sounds are normal. There is no distension.     Palpations: Abdomen is soft.     Tenderness: There is no abdominal tenderness.  Musculoskeletal:     Right lower leg: No edema.     Left lower leg: No edema.  Skin:    General: Skin is warm and dry.     Findings: No rash.  Neurological:     Mental Status: He is alert.        Assessment & Plan:   Acute sinusitis He has acute sinusitis.  He can continue his nose spray.  I will start him on augmentin at this  time.    No follow-ups on file.   Crist Fat, MD

## 2022-02-28 NOTE — Assessment & Plan Note (Signed)
He has acute sinusitis.  He can continue his nose spray.  I will start him on augmentin at this time.

## 2022-03-07 NOTE — Progress Notes (Signed)
Pt notified of lab results

## 2022-03-17 ENCOUNTER — Other Ambulatory Visit: Payer: Self-pay

## 2022-03-17 MED ORDER — AMOXICILLIN-POT CLAVULANATE 875-125 MG PO TABS: 1.0000 | ORAL_TABLET | Freq: Two times a day (BID) | ORAL | 0 refills | Status: DC

## 2022-04-12 ENCOUNTER — Ambulatory Visit: Payer: Managed Care, Other (non HMO) | Admitting: Internal Medicine

## 2022-04-12 ENCOUNTER — Encounter: Payer: Self-pay | Admitting: Internal Medicine

## 2022-04-12 VITALS — BP 118/68 | HR 89 | Temp 98.4°F | Resp 18 | Ht 76.0 in | Wt 294.4 lb

## 2022-04-12 DIAGNOSIS — J0100 Acute maxillary sinusitis, unspecified: Secondary | ICD-10-CM

## 2022-04-12 MED ORDER — AMOXICILLIN-POT CLAVULANATE 875-125 MG PO TABS: 1.0000 | ORAL_TABLET | Freq: Two times a day (BID) | ORAL | 0 refills | Status: DC

## 2022-04-12 NOTE — Assessment & Plan Note (Signed)
I want him to continue his OTC meds and we will give him a course of augmentin at this time.

## 2022-04-12 NOTE — Progress Notes (Signed)
Office Visit  Subjective   Patient ID: Add Dinapoli   DOB: January 05, 1971   Age: 52 y.o.   MRN: [id number]   Chief Complaint Chief Complaint  Patient presents with   Nasal Congestion    Yellow mucus with blood;symptoms for about a week     History of Present Illness The patient is a 52 year old Caucasian/White male who presents with upper respiratory tract symptoms which began 1 week ago. He complains of sinus congestion with yellow nasal discharge with some blood tingue without post nasal drip associated and a nonproductive cough,    He has no additional complaints. He denies body aches, fever, chills, shortness of breath, nausea, vomiting, diarrhea, chest congestion, or wheezing. Alleviating factors: alka selzter and mucinex nasal spray.  The patient's past medical history is notable for a history of allergies.                         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,   s/p mechanical thrombectomy ballon angioplasty  and thrombolysis x2  (01-13-2021  pt stated had negative work-up for bleeding order,  and had no previous clot prior and none since 2011)   Hypertension    Right hydrocele    Scoliosis    per pt severe   Thoracic outlet syndrome 11/2009   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, s/p thrombectomy and thrombolysis x2 ;   s/p transaxillary left first rib resection 11/ 2011     Allergies Allergies  Allergen Reactions   Armoracia Rusticana Ext (Horseradish) Nausea And Vomiting   Morphine And Related Hives and Nausea  And Vomiting   Topamax [Topiramate]    Penicillins Itching and Rash     Medications  Current Outpatient Medications:    amoxicillin-clavulanate (AUGMENTIN) 875-125 MG tablet, Take 1 tablet by mouth 2 (two) times daily., Disp: 20 tablet, Rfl: 0   Carboxymethylcellulose Sodium (DRY EYE RELIEF OP), Apply to eye as needed., Disp: , Rfl:    cetirizine (ZYRTEC) 10 MG tablet, Take 10 mg by mouth daily., Disp: , Rfl:    hydrochlorothiazide (MICROZIDE) 12.5 MG capsule, Take 12.5 mg by mouth daily., Disp: , Rfl:    lisinopril (ZESTRIL) 20 MG tablet, Take 20 mg by mouth daily., Disp: , Rfl:    Multiple Vitamins-Minerals (MULTIVITAMIN WITH MINERALS) tablet, Take 1 tablet by mouth daily., Disp: , Rfl:    Oxymetazoline HCl (MUCINEX SINUS-MAX CLEAR & COOL NA), Place into the nose as needed., Disp: , Rfl:    pravastatin (PRAVACHOL) 40 MG tablet, Take 1 tablet (40 mg total) by mouth at bedtime., Disp: 90 tablet, Rfl: 1   Review of Systems Review of Systems  Constitutional:  Negative for chills and fever.  HENT:  Positive for congestion. Negative for sore throat.   Respiratory:  Positive for cough. Negative for sputum production, shortness of breath and wheezing.   Cardiovascular:  Negative for chest pain and leg swelling.  Gastrointestinal:  Negative for abdominal pain, constipation, diarrhea, nausea and vomiting.  Musculoskeletal:  Negative for myalgias.  Neurological:  Negative for dizziness, weakness and headaches.       Objective:    Vitals BP 118/68 (BP Location: Right Arm, Patient Position: Sitting, Cuff Size: Normal)   Pulse 89   Temp 98.4 F (36.9 C) (Temporal)   Resp 18   Ht 6\' 4"  (1.93 m)   Wt 294 lb 6.4 oz (133.5 kg)   SpO2 94%   BMI 35.84 kg/m    Physical Examination Physical Exam Constitutional:      Appearance: Normal appearance. He is not ill-appearing.  HENT:     Right Ear: Ear canal and external ear normal.     Left Ear: Ear canal and external ear normal.     Ears:      Comments: He clear serous effusion behind both TM's.    Nose: Congestion and rhinorrhea present.     Mouth/Throat:     Mouth: Mucous membranes are moist.     Pharynx: Oropharynx is clear. No oropharyngeal exudate or posterior oropharyngeal erythema.     Comments: Post nasal drip Cardiovascular:     Rate and Rhythm: Normal rate and regular rhythm.     Pulses: Normal pulses.     Heart sounds: No murmur heard.    No friction rub. No gallop.  Pulmonary:     Effort: Pulmonary effort is normal. No respiratory distress.     Breath sounds: No wheezing, rhonchi or rales.  Abdominal:     General: Abdomen is flat. Bowel sounds are normal. There is no distension.     Palpations: Abdomen is soft.     Tenderness: There is no abdominal tenderness.  Musculoskeletal:     Right lower leg: No edema.     Left lower leg: No edema.  Neurological:     Mental Status: He is alert.        Assessment & Plan:   Acute sinusitis I want him to continue his OTC meds and we will give him a course of augmentin at this time.    No follow-ups on file.   Townsend Roger, MD

## 2022-04-13 ENCOUNTER — Other Ambulatory Visit: Payer: Self-pay

## 2022-04-13 MED ORDER — LISINOPRIL 20 MG PO TABS: 20.0000 mg | ORAL_TABLET | Freq: Every day | ORAL | 1 refills | Status: DC

## 2022-04-13 MED ORDER — NABUMETONE 750 MG PO TABS: 750.0000 mg | ORAL_TABLET | Freq: Two times a day (BID) | ORAL | 1 refills | Status: DC

## 2022-04-13 MED ORDER — HYDROCHLOROTHIAZIDE 12.5 MG PO CAPS: 12.5000 mg | ORAL_CAPSULE | Freq: Every day | ORAL | 1 refills | Status: DC

## 2022-04-27 ENCOUNTER — Other Ambulatory Visit: Payer: Self-pay | Admitting: Internal Medicine

## 2022-04-27 MED ORDER — HYDROCOD POLI-CHLORPHE POLI ER 10-8 MG/5ML PO SUER: 5.0000 mL | Freq: Two times a day (BID) | ORAL | 0 refills | Status: DC | PRN

## 2022-04-28 ENCOUNTER — Other Ambulatory Visit: Payer: Self-pay | Admitting: Internal Medicine

## 2022-04-28 MED ORDER — HYDROCOD POLI-CHLORPHE POLI ER 10-8 MG/5ML PO SUER: 5.0000 mL | Freq: Two times a day (BID) | ORAL | 0 refills | Status: DC | PRN

## 2022-05-05 ENCOUNTER — Ambulatory Visit: Payer: Managed Care, Other (non HMO) | Admitting: Internal Medicine

## 2022-05-05 ENCOUNTER — Other Ambulatory Visit: Payer: Self-pay | Admitting: Internal Medicine

## 2022-05-05 ENCOUNTER — Encounter: Payer: Self-pay | Admitting: Internal Medicine

## 2022-05-05 VITALS — BP 138/80 | HR 90 | Temp 97.3°F | Resp 16 | Ht 76.0 in | Wt 287.6 lb

## 2022-05-05 DIAGNOSIS — J0100 Acute maxillary sinusitis, unspecified: Secondary | ICD-10-CM | POA: Diagnosis not present

## 2022-05-05 DIAGNOSIS — J45901 Unspecified asthma with (acute) exacerbation: Secondary | ICD-10-CM | POA: Diagnosis not present

## 2022-05-05 MED ORDER — METHYLPREDNISOLONE 4 MG PO TBPK: ORAL_TABLET | ORAL | 0 refills | Status: DC

## 2022-05-05 MED ORDER — ALBUTEROL SULFATE HFA 108 (90 BASE) MCG/ACT IN AERS: 2.0000 | INHALATION_SPRAY | Freq: Four times a day (QID) | RESPIRATORY_TRACT | 0 refills | Status: DC | PRN

## 2022-05-05 MED ORDER — AMOXICILLIN-POT CLAVULANATE 875-125 MG PO TABS: 1.0000 | ORAL_TABLET | Freq: Two times a day (BID) | ORAL | 0 refills | Status: DC

## 2022-05-05 NOTE — Assessment & Plan Note (Signed)
We did test him for COVID-19 today but this was negative.  I think he probably did have COVID at that time.  We will give another course of augmentin as this usually does help.

## 2022-05-05 NOTE — Progress Notes (Signed)
Office Visit  Subjective   Patient ID: Todd Powers   DOB: [birth year]   Age: 52 y.o.   MRN: [id number]   Chief Complaint Chief Complaint  Patient presents with   Acute Visit    Cough, wheezing     History of Present Illness The patient is a 52 year old Caucasian/White male who presents with upper respiratory tract symptoms which began 12 days ago.  He states that he was hanging out with his brother before that who subsequently became sick and tested positive for COVID-19.  His wife then began having symptoms as well and he felt that both him and his wife had COVID but did not test and did not see any medical personnel for this.  His symptoms started 12 days ago with sinus congestion with green nasal discharge with post nasal discharge with sore throat and cough productive of green sputum.  He was having myalgais as well as SOB and wheezing a that time.  He otherwise was not having any fevers, chills, headaches nor any n/v or diarrhea.  Today, he is still having cough productive of yellow/green sputum and he is wheezing.  HAlleviating factors: alka selzter, mucinex cough syrup and mucinex nasal spray.  The patient's past medical history is notable for a history of allergies.      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,   s/p mechanical thrombectomy ballon angioplasty  and thrombolysis x2  (01-13-2021  pt stated had negative work-up for bleeding order,  and had no previous clot prior and none since 2011)   Hypertension    Right hydrocele    Scoliosis    per pt severe   Thoracic outlet syndrome 11/2009   left  subclavian axillary 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, s/p thrombectomy and thrombolysis x2 ;   s/p transaxillary left first rib resection 11/ 2011     Allergies Allergies  Allergen Reactions   Armoracia Rusticana Ext (Horseradish) Nausea And Vomiting   Morphine And Related Hives and Nausea And Vomiting   Topamax [Topiramate]    Penicillins Itching and Rash     Medications  Current Outpatient Medications:    Carboxymethylcellulose Sodium (DRY EYE RELIEF OP), Apply to eye as needed., Disp: , Rfl:    cetirizine (ZYRTEC) 10 MG tablet, Take 10 mg by mouth daily., Disp: , Rfl:    chlorpheniramine-HYDROcodone (TUSSIONEX) 10-8 MG/5ML, Take 5 mLs by mouth every 12 (twelve) hours as needed for cough., Disp: 115 mL, Rfl: 0   hydrochlorothiazide (MICROZIDE) 12.5 MG capsule, Take 1 capsule (12.5 mg total) by mouth daily., Disp: 90 capsule, Rfl: 1   lisinopril (ZESTRIL) 20 MG tablet, Take 1 tablet (20 mg total) by mouth daily., Disp: 90 tablet, Rfl: 1   Multiple Vitamins-Minerals (MULTIVITAMIN WITH MINERALS) tablet, Take 1 tablet by mouth daily., Disp: , Rfl:    nabumetone (RELAFEN) 750 MG tablet, Take 1 tablet (750 mg total) by mouth 2 (two) times daily., Disp: 180 tablet, Rfl: 1   Oxymetazoline HCl (MUCINEX SINUS-MAX CLEAR & COOL NA), Place into the nose as needed.,  Disp: , Rfl:    pravastatin (PRAVACHOL) 40 MG tablet, Take 1 tablet (40 mg total) by mouth at bedtime., Disp: 90 tablet, Rfl: 1   Review of Systems Review of Systems  Constitutional:  Negative for chills and fever.  HENT:  Positive for congestion. Negative for sore throat.   Respiratory:  Positive for cough, sputum production, shortness of breath and wheezing.   Cardiovascular:  Negative for chest pain, palpitations and leg swelling.  Gastrointestinal:  Negative for abdominal pain, constipation, diarrhea, nausea and vomiting.  Musculoskeletal:  Negative for myalgias.  Neurological:   Positive for weakness. Negative for dizziness and headaches.       Objective:    Vitals BP 138/80   Pulse 90   Temp (!) 97.3 F (36.3 C)   Resp 16   Ht '6\' 4"'$  (1.93 m)   Wt 287 lb 9.6 oz (130.5 kg)   SpO2 98%   BMI 35.01 kg/m    Physical Examination Physical Exam Constitutional:      Appearance: Normal appearance. He is not ill-appearing.  Cardiovascular:     Rate and Rhythm: Normal rate and regular rhythm.     Pulses: Normal pulses.     Heart sounds: No murmur heard.    No friction rub. No gallop.  Pulmonary:     Effort: Pulmonary effort is normal. No respiratory distress.     Breath sounds: No wheezing, rhonchi or rales.  Abdominal:     General: Abdomen is flat. Bowel sounds are normal. There is no distension.     Palpations: Abdomen is soft.     Tenderness: There is no abdominal tenderness.  Musculoskeletal:     Right lower leg: No edema.     Left lower leg: No edema.  Skin:    General: Skin is warm and dry.     Findings: No rash.  Neurological:     Mental Status: He is alert.        Assessment & Plan:   Acute sinusitis We did test him for COVID-19 today but this was negative.  I think he probably did have COVID at that time.  We will give another course of augmentin as this usually does help.  Reactive airway disease with acute exacerbation He is wheezing especially at night.  He is not wheezing today on my exam and his oxygen sats are doing well.  We will give him a medrol dose pak and albuterol inhaler.    No follow-ups on file.   Townsend Roger, MD

## 2022-05-05 NOTE — Assessment & Plan Note (Signed)
He is wheezing especially at night.  He is not wheezing today on my exam and his oxygen sats are doing well.  We will give him a medrol dose pak and albuterol inhaler.

## 2022-07-20 ENCOUNTER — Other Ambulatory Visit: Payer: Self-pay | Admitting: Internal Medicine

## 2022-07-21 ENCOUNTER — Other Ambulatory Visit: Payer: Self-pay | Admitting: Internal Medicine

## 2022-08-22 ENCOUNTER — Other Ambulatory Visit: Payer: Self-pay | Admitting: Internal Medicine

## 2022-08-28 ENCOUNTER — Ambulatory Visit: Payer: Managed Care, Other (non HMO) | Admitting: Internal Medicine

## 2022-08-28 ENCOUNTER — Encounter: Payer: Self-pay | Admitting: Internal Medicine

## 2022-08-28 VITALS — BP 130/88 | HR 84 | Temp 98.1°F | Resp 16 | Ht 76.0 in | Wt 281.4 lb

## 2022-08-28 DIAGNOSIS — R233 Spontaneous ecchymoses: Secondary | ICD-10-CM | POA: Diagnosis not present

## 2022-08-28 NOTE — Assessment & Plan Note (Signed)
He has a large differential for petechial rash but his CBC with diff was near normal with mild monocytosis but his platelet count was normal.  His CMP was normal and his ESR was normal.  I am going to check him for mononucleosis and for rocky mountain spotted fever.  He is not having pain, confusion, fevers or other problems.  I him to continue on his doxycyline and his biopsy report from dermatology should come back later this week.

## 2022-08-28 NOTE — Progress Notes (Signed)
Office Visit  Subjective   Patient ID: Todd Powers   DOB: 1970-06-25   Age: 52 y.o.   MRN: [id number]   Chief Complaint Chief Complaint  Patient presents with   Acute Visit    Red spots on skin     History of Present Illness Karma is a 52 yo male who comes in today with a petchiael rash involving the top of his bilateral feet, thighs and his arms as well as a circumscribed rash about the size of a baseball on his left flank.  He went to see dermatology last week where they did a biopsy at that time.  He went to urgent care Thursday night as he felt that the rash was getting worse (he had appt to see dermatology the next morning).  Urgent care did not know what this was but they took blood and gave him a kenalog shot at that time.  Dermatology saw his results this morning and told him to go to his PCP today as he had a high monocyte count on his CBC.  Dermatology had tested him for strep and this was negative.  Today, he also tells me he has arthralgias in his bilateral knees and ankles that started on 08/12/2022.  The patient is not having any fevers, chills, headaches or other problems except for fatigue.  He states he was walking in tall grass about 08/06/2022 but denies any tick bites at that time.  He did see a tick on him and it was not attached and he removed it immediately.  The patient was placed on doxycycline on 07/25/2022.  He states he did have a slight sore throat before all this occurred.     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,   s/p mechanical thrombectomy ballon angioplasty  and thrombolysis x2   (01-13-2021  pt stated had negative work-up for bleeding order,  and had no previous clot prior and none since 2011)   Hypertension    Right hydrocele    Scoliosis    per pt severe   Thoracic outlet syndrome 11/2009   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, s/p thrombectomy and thrombolysis x2 ;   s/p transaxillary left first rib resection 11/ 2011     Allergies Allergies  Allergen Reactions   Armoracia Rusticana Ext (Horseradish) Nausea And Vomiting   Morphine And Codeine Hives and Nausea And Vomiting   Topamax [Topiramate]    Penicillins Itching and Rash     Medications  Current Outpatient Medications:    albuterol (VENTOLIN HFA) 108 (90 Base) MCG/ACT inhaler, Inhale 2 puffs into the lungs every 6 (six) hours as needed for wheezing or shortness of breath., Disp: 8 g, Rfl: 0   amoxicillin-clavulanate (AUGMENTIN) 875-125 MG tablet, Take 1 tablet by mouth 2 (two) times daily., Disp: 20 tablet, Rfl: 0   Carboxymethylcellulose Sodium (DRY EYE RELIEF OP), Apply to eye as needed., Disp: , Rfl:    cetirizine (ZYRTEC) 10 MG tablet, Take 10 mg by mouth daily., Disp: , Rfl:    chlorpheniramine-HYDROcodone (TUSSIONEX)  10-8 MG/5ML, Take 5 mLs by mouth every 12 (twelve) hours as needed for cough., Disp: 115 mL, Rfl: 0   hydrochlorothiazide (HYDRODIURIL) 12.5 MG tablet, Take 1 tablet by mouth daily., Disp: 90 tablet, Rfl: 1   hydrochlorothiazide (MICROZIDE) 12.5 MG capsule, Take 1 capsule (12.5 mg total) by mouth daily., Disp: 90 capsule, Rfl: 1   lisinopril (ZESTRIL) 20 MG tablet, Take 1 tablet by mouth daily., Disp: 90 tablet, Rfl: 0   methylPREDNISolone (MEDROL DOSEPAK) 4 MG TBPK tablet, Use as directed, Disp: 21 tablet, Rfl: 0   Multiple Vitamins-Minerals (MULTIVITAMIN WITH MINERALS) tablet, Take 1 tablet by mouth daily., Disp: , Rfl:    nabumetone (RELAFEN) 750 MG tablet, Take 1 tablet by mouth twice daily., Disp: 180 tablet,  Rfl: 0   Oxymetazoline HCl (MUCINEX SINUS-MAX CLEAR & COOL NA), Place into the nose as needed., Disp: , Rfl:    pravastatin (PRAVACHOL) 40 MG tablet, TAKE 1 TABLET BY MOUTH EVERYDAY AT BEDTIME, Disp: 90 tablet, Rfl: 1   Review of Systems Review of Systems  Constitutional:  Negative for chills and fever.  Eyes:  Negative for blurred vision and double vision.  Respiratory:  Positive for cough. Negative for shortness of breath.   Cardiovascular:  Negative for chest pain and palpitations.  Gastrointestinal:  Negative for abdominal pain, constipation, diarrhea, nausea and vomiting.  Musculoskeletal:  Positive for neck pain. Negative for myalgias.  Skin:  Positive for rash. Negative for itching.  Neurological:  Negative for dizziness, weakness and headaches.       Objective:    Vitals BP 130/88   Pulse 84   Temp 98.1 F (36.7 C)   Resp 16   Ht 6\' 4"  (1.93 m)   Wt 281 lb 6.4 oz (127.6 kg)   SpO2 99%   BMI 34.25 kg/m    Physical Examination Physical Exam Constitutional:      Appearance: Normal appearance. He is not ill-appearing.  Cardiovascular:     Rate and Rhythm: Normal rate and regular rhythm.     Pulses: Normal pulses.     Heart sounds: No murmur heard.    No friction rub. No gallop.  Pulmonary:     Effort: Pulmonary effort is normal. No respiratory distress.     Breath sounds: No wheezing, rhonchi or rales.  Abdominal:     General: Abdomen is flat. Bowel sounds are normal. There is no distension.     Palpations: Abdomen is soft.     Tenderness: There is no abdominal tenderness.  Musculoskeletal:     Right lower leg: No edema.     Left lower leg: No edema.  Skin:    General: Skin is warm and dry.     Findings: No rash.  Neurological:     Mental Status: He is alert.        Assessment & Plan:   Petechial rash He has a large differential for petechial rash but his CBC with diff was near normal with mild monocytosis but his platelet count was normal.  His CMP  was normal and his ESR was normal.  I am going to check him for mononucleosis and for rocky mountain spotted fever.  He is not having pain, confusion, fevers or other problems.  I him to continue on his doxycyline and his biopsy report from dermatology should come back later this week.    No follow-ups on file.   Crist Fat, MD

## 2022-08-29 LAB — EPSTEIN-BARR VIRUS (EBV) ANTIBODY PROFILE
EBV NA IgG: >600 U/mL — ABNORMAL HIGH (ref 0.0–17.9)
EBV VCA IgG: 361 U/mL — ABNORMAL HIGH (ref 0.0–17.9)
EBV VCA IgM: 52 U/mL — ABNORMAL HIGH (ref 0.0–35.9)

## 2022-11-20 ENCOUNTER — Other Ambulatory Visit: Payer: Self-pay | Admitting: Internal Medicine

## 2022-11-20 MED ORDER — AMOXICILLIN-POT CLAVULANATE 875-125 MG PO TABS: 1.0000 | ORAL_TABLET | Freq: Two times a day (BID) | ORAL | 0 refills | Status: DC

## 2022-11-22 ENCOUNTER — Ambulatory Visit: Payer: Managed Care, Other (non HMO) | Admitting: Internal Medicine

## 2022-11-22 ENCOUNTER — Encounter: Payer: Self-pay | Admitting: Internal Medicine

## 2022-11-22 VITALS — BP 138/88 | HR 85 | Temp 97.8°F | Resp 17 | Ht 76.0 in | Wt 296.0 lb

## 2022-11-22 DIAGNOSIS — J40 Bronchitis, not specified as acute or chronic: Secondary | ICD-10-CM | POA: Insufficient documentation

## 2022-11-22 MED ORDER — ALBUTEROL SULFATE HFA 108 (90 BASE) MCG/ACT IN AERS: 2.0000 | INHALATION_SPRAY | Freq: Four times a day (QID) | RESPIRATORY_TRACT | 0 refills | Status: DC | PRN

## 2022-11-22 MED ORDER — HYDROCOD POLI-CHLORPHE POLI ER 10-8 MG/5ML PO SUER: 5.0000 mL | Freq: Two times a day (BID) | ORAL | 0 refills | Status: DC | PRN

## 2022-11-22 NOTE — Progress Notes (Signed)
Office Visit  Subjective   Patient ID: Todd Powers   DOB: 1970-10-29   Age: 52 y.o.   MRN: [id number]   Chief Complaint Chief Complaint  Patient presents with   Acute Visit    Cough x 3 weeks, conjestion     History of Present Illness Ethane is a 52 yo male who comes in today with complaints of a respiratory tract infection.  He states 3 weeks ago he began with a productive cough that was initially with white sputum.  This developed into chest congestion and cough productive of yellow sputum.  He now has some sinus congestion with yellow nasal discharge.  There is sinus pressure but no fevers, chills, headaches, myaglias, or nausea/vomiting.  He has had some SOB and wheezing.  He has been taking zyrtec and alka seltzer mucus cough.  He did call me over the weekend and he was started on augmentin yesterday.       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,   s/p mechanical thrombectomy ballon angioplasty  and thrombolysis x2  (01-13-2021  pt stated had negative work-up for bleeding order,  and had no previous clot prior and none since 2011)   Hypertension    Right hydrocele    Scoliosis    per pt severe   Thoracic outlet syndrome 11/2009   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, s/p thrombectomy and thrombolysis x2 ;   s/p transaxillary left first rib resection 11/ 2011     Allergies Allergies  Allergen Reactions   Armoracia Rusticana Ext (Horseradish) Nausea And Vomiting   Morphine And Codeine Hives and Nausea And Vomiting   Topamax  [Topiramate]    Penicillins Itching and Rash     Medications  Current Outpatient Medications:    albuterol (VENTOLIN HFA) 108 (90 Base) MCG/ACT inhaler, Inhale 2 puffs into the lungs every 6 (six) hours as needed for wheezing or shortness of breath., Disp: 8 g, Rfl: 0   amoxicillin-clavulanate (AUGMENTIN) 875-125 MG tablet, Take 1 tablet by mouth 2 (two) times daily., Disp: 20 tablet, Rfl: 0   Carboxymethylcellulose Sodium (DRY EYE RELIEF OP), Apply to eye as needed., Disp: , Rfl:    cetirizine (ZYRTEC) 10 MG tablet, Take 10 mg by mouth daily., Disp: , Rfl:    chlorpheniramine-HYDROcodone (TUSSIONEX) 10-8 MG/5ML, Take 5 mLs by mouth every 12 (twelve) hours as needed for cough., Disp: 115 mL, Rfl: 0   hydrochlorothiazide (HYDRODIURIL) 12.5 MG tablet, Take 1 tablet by mouth daily., Disp: 90 tablet, Rfl: 1   hydrochlorothiazide (MICROZIDE) 12.5 MG capsule, Take 1 capsule (12.5 mg total) by mouth daily., Disp: 90 capsule, Rfl: 1   lisinopril (ZESTRIL) 20 MG tablet, Take 1 tablet by mouth daily., Disp: 90 tablet, Rfl: 0   Multiple Vitamins-Minerals (MULTIVITAMIN WITH MINERALS) tablet, Take 1 tablet by mouth daily., Disp: , Rfl:    nabumetone (RELAFEN) 750 MG tablet, Take 1 tablet by mouth twice daily., Disp: 180 tablet, Rfl: 0   Oxymetazoline HCl (MUCINEX SINUS-MAX CLEAR & COOL NA), Place into the nose as  needed., Disp: , Rfl:    pravastatin (PRAVACHOL) 40 MG tablet, TAKE 1 TABLET BY MOUTH EVERYDAY AT BEDTIME, Disp: 90 tablet, Rfl: 1   Review of Systems Review of Systems  Constitutional:  Negative for chills and fever.  HENT:  Positive for congestion and sinus pain. Negative for sore throat.   Respiratory:  Positive for cough, sputum production, shortness of breath and wheezing.   Gastrointestinal:  Negative for abdominal pain, constipation, diarrhea, nausea and vomiting.  Skin:  Negative for itching and rash.  Neurological:  Negative for dizziness, weakness and headaches.       Objective:     Vitals BP 138/88   Pulse 85   Temp 97.8 F (36.6 C)   Resp 17   Ht 6\' 4"  (1.93 m)   Wt 296 lb (134.3 kg)   SpO2 97%   BMI 36.03 kg/m    Physical Examination Physical Exam Constitutional:      Appearance: Normal appearance. He is not ill-appearing.  HENT:     Right Ear: Tympanic membrane, ear canal and external ear normal.     Left Ear: Tympanic membrane, ear canal and external ear normal.     Nose: Nose normal. No congestion or rhinorrhea.     Mouth/Throat:     Mouth: Mucous membranes are moist.     Pharynx: Oropharynx is clear. No oropharyngeal exudate or posterior oropharyngeal erythema.  Cardiovascular:     Rate and Rhythm: Normal rate and regular rhythm.     Pulses: Normal pulses.     Heart sounds: No murmur heard.    No friction rub. No gallop.  Pulmonary:     Effort: Pulmonary effort is normal. No respiratory distress.     Breath sounds: No wheezing, rhonchi or rales.  Abdominal:     General: Abdomen is flat. Bowel sounds are normal. There is no distension.     Palpations: Abdomen is soft.     Tenderness: There is no abdominal tenderness.  Musculoskeletal:     Right lower leg: No edema.     Left lower leg: No edema.  Skin:    General: Skin is warm and dry.     Findings: No rash.  Neurological:     Mental Status: He is alert.        Assessment & Plan:   Bronchitis I hear no wheezing on exam today.  We will continue him on augmentin at this time.  I will also give him a cough syrup and I want him to use a neti pot.  His COVID-19 test was negative.    No follow-ups on file.   Crist Fat, MD

## 2022-11-22 NOTE — Addendum Note (Signed)
Addended by: Crist Fat on: 11/22/2022 08:47 AM   Modules accepted: Orders

## 2022-11-22 NOTE — Assessment & Plan Note (Addendum)
I hear no wheezing on exam today.  We will continue him on augmentin at this time.  I will also give him a cough syrup and I want him to use a neti pot.  His COVID-19 test was negative.

## 2022-12-16 ENCOUNTER — Other Ambulatory Visit: Payer: Self-pay | Admitting: Internal Medicine

## 2022-12-21 ENCOUNTER — Encounter: Payer: Self-pay | Admitting: Student

## 2022-12-21 ENCOUNTER — Ambulatory Visit: Payer: Managed Care, Other (non HMO) | Admitting: Student

## 2022-12-21 VITALS — BP 130/80 | HR 76 | Temp 97.6°F | Resp 16 | Ht 76.5 in | Wt 284.3 lb

## 2022-12-21 DIAGNOSIS — H65112 Acute and subacute allergic otitis media (mucoid) (sanguinous) (serous), left ear: Secondary | ICD-10-CM

## 2022-12-21 DIAGNOSIS — R42 Dizziness and giddiness: Secondary | ICD-10-CM | POA: Diagnosis not present

## 2022-12-21 MED ORDER — MECLIZINE HCL 50 MG PO TABS: 25.0000 mg | ORAL_TABLET | Freq: Three times a day (TID) | ORAL | 0 refills | Status: DC | PRN

## 2022-12-21 MED ORDER — CEFDINIR 300 MG PO CAPS
600.0000 mg | ORAL_CAPSULE | Freq: Every day | ORAL | 0 refills | Status: DC
Start: 2022-12-21 — End: 2023-02-02

## 2022-12-21 MED ORDER — METHYLPREDNISOLONE SODIUM SUCC 40 MG IJ SOLR
40.0000 mg | Freq: Once | INTRAMUSCULAR | Status: AC
Start: 2022-12-21 — End: 2022-12-21
  Administered 2022-12-21: 40 mg via INTRAMUSCULAR

## 2022-12-21 NOTE — Assessment & Plan Note (Signed)
Orthostatic vitals were negative for orthostatic hypotension.  He reports that the dizziness has resolved slightly.  I will send in meclizine to use as needed for dizziness.

## 2022-12-21 NOTE — Progress Notes (Deleted)
   Established Patient Office Visit  Subjective   Patient ID: Todd Powers, male    DOB: 03-12-1971  Age: 52 y.o. MRN: [id number]  Chief Complaint  Patient presents with  . Dizziness    After eating out yesterday he started feeling bad, dizziness and ears felt full. A little nauseous also.Marland Kitchen    HPI  {History (Optional):23778}  ROS    Objective:     BP 130/80   Pulse 76   Temp 97.6 F (36.4 C)   Resp 16   Ht 6' 4.5" (1.943 m)   Wt 284 lb 5 oz (129 kg)   SpO2 96%   BMI 34.16 kg/m  {Vitals History (Optional):23777}  Physical Exam   No results found for any visits on 12/21/22.  {Labs (Optional):23779}  The 10-year ASCVD risk score (Arnett DK, et al., 2019) is: 4%    Assessment & Plan:   Problem List Items Addressed This Visit   None   No follow-ups on file.    Edwena Blow, NP

## 2022-12-21 NOTE — Assessment & Plan Note (Addendum)
The left ear l with mid ear effusion and decreased mobility of the TM.  The patient reports history of otitis media.  I will treat him with cefdinir 600 mg daily for 5 days.  He received 40 mg Solu-Medrol IM injection.

## 2022-12-21 NOTE — Progress Notes (Signed)
Acute Office Visit  Subjective:     Patient ID: Gwenette Greet, male    DOB: 11/23/1970, 52 y.o.   MRN: [id number]  Chief Complaint  Patient presents with   Dizziness    After eating out yesterday he started feeling bad, dizziness and ears felt full. A little nauseous also.Marland Kitchen    HPI  Subjective:    Tayler Lassen is a 52 y.o. male who is here for evaluation of dizziness. The dizziness has been present for 1 day after eating a meal with high sodium content.  He describes the symptoms as disequalibirum and lightheadedness with ear fullness and mild nausea. Symptoms are exacerbated by rapid head movements, bending, and motion within. He also complains of . He denies LOC, headache, congestion, cough, shortness of breath or wheezing.  He has been treated with meclizine (Antivert) and steroids with good improvement.      Review of Systems  Constitutional: Negative.   HENT:  Positive for tinnitus. Negative for congestion, ear discharge, nosebleeds, sinus pain and sore throat.   Eyes: Negative.   Respiratory: Negative.    Cardiovascular: Negative.   Gastrointestinal:  Positive for nausea.  Skin: Negative.   Neurological: Negative.         Objective:    BP 130/80   Pulse 76   Temp 97.6 F (36.4 C)   Resp 16   Ht 6' 4.5" (1.943 m)   Wt 284 lb 5 oz (129 kg)   SpO2 96%   BMI 34.16 kg/m    Physical Exam Vitals reviewed.  Constitutional:      Appearance: Normal appearance. He is obese.  HENT:     Head: Normocephalic and atraumatic.     Right Ear: Tympanic membrane, ear canal and external ear normal.     Left Ear: Ear canal and external ear normal. A middle ear effusion is present. Tympanic membrane is not erythematous. Tympanic membrane has decreased mobility.     Nose: Nose normal. No congestion.     Mouth/Throat:     Mouth: Mucous membranes are moist.     Pharynx: Oropharynx is clear. No oropharyngeal exudate or posterior oropharyngeal erythema.  Eyes:      Conjunctiva/sclera: Conjunctivae normal.     Pupils: Pupils are equal, round, and reactive to light.  Neck:     Vascular: No carotid bruit.  Cardiovascular:     Rate and Rhythm: Normal rate and regular rhythm.     Pulses: Normal pulses.     Heart sounds: Normal heart sounds.  Pulmonary:     Effort: Pulmonary effort is normal.     Breath sounds: Normal breath sounds.  Abdominal:     General: Bowel sounds are normal.     Palpations: Abdomen is soft.  Musculoskeletal:     Cervical back: Normal range of motion and neck supple.  Skin:    General: Skin is warm and dry.     Capillary Refill: Capillary refill takes less than 2 seconds.  Neurological:     Mental Status: He is alert and oriented to person, place, and time.     Cranial Nerves: Cranial nerves 2-12 are intact.     Motor: Motor function is intact.     Coordination: Coordination is intact.     Gait: Gait is intact.  Psychiatric:        Mood and Affect: Mood normal.        Behavior: Behavior normal.     No results found for any visits on  12/21/22.      Assessment & Plan:   Problem List Items Addressed This Visit     Non-recurrent subacute allergic otitis media of left ear - Primary    The left ear l with mid ear effusion and decreased mobility of the TM.  The patient reports history of otitis media.  I will treat him with cefdinir 600 mg daily for 5 days.  He received 40 mg Solu-Medrol IM injection.      Relevant Medications   cefdinir (OMNICEF) 300 MG capsule   Dizziness    Orthostatic vitals were negative for orthostatic hypotension.  He reports that the dizziness has resolved slightly.  I will send in meclizine to use as needed for dizziness.      Relevant Orders   CBC   Basic metabolic panel   Orthostatic vital signs    Meds ordered this encounter  Medications   methylPREDNISolone sodium succinate (SOLU-MEDROL) 40 mg/mL injection 40 mg   cefdinir (OMNICEF) 300 MG capsule    Sig: Take 2 capsules (600 mg  total) by mouth daily.    Dispense:  10 capsule    Refill:  0   meclizine (ANTIVERT) 50 MG tablet    Sig: Take 0.5 tablets (25 mg total) by mouth 3 (three) times daily as needed.    Dispense:  30 tablet    Refill:  0    No follow-ups on file.  Edwena Blow, NP

## 2022-12-22 LAB — CBC
Hematocrit: 51.7 % — ABNORMAL HIGH (ref 37.5–51.0)
Hemoglobin: 16.6 g/dL (ref 13.0–17.7)
MCH: 30.2 pg (ref 26.6–33.0)
MCHC: 32.1 g/dL (ref 31.5–35.7)
MCV: 94 fL (ref 79–97)
Platelets: 314 10*3/uL (ref 150–450)
RBC: 5.5 x10E6/uL (ref 4.14–5.80)
RDW: 11.9 % (ref 11.6–15.4)
WBC: 7 10*3/uL (ref 3.4–10.8)

## 2022-12-22 LAB — BASIC METABOLIC PANEL
BUN/Creatinine Ratio: 17 (ref 9–20)
BUN: 19 mg/dL (ref 6–24)
CO2: 25 mmol/L (ref 20–29)
Calcium: 10.2 mg/dL (ref 8.7–10.2)
Chloride: 103 mmol/L (ref 96–106)
Creatinine, Ser: 1.13 mg/dL (ref 0.76–1.27)
Glucose: 87 mg/dL (ref 70–99)
Potassium: 5 mmol/L (ref 3.5–5.2)
Sodium: 142 mmol/L (ref 134–144)
eGFR: 79 mL/min/{1.73_m2} (ref >59)

## 2022-12-22 NOTE — Progress Notes (Signed)
I have reviewed the results of your recent test and lab work that you underwent at our clinic. Findings indicate normal labs. His hematocrit was slightly elevated. There is nothing to do for this. I hope he is feeling better.

## 2022-12-22 NOTE — Progress Notes (Signed)
Leak, Luetta Nutting, NP  Christianne Dolin, CMA  I have reviewed the results of your recent test and lab work that you underwent at our clinic. Findings indicate normal labs. His hematocrit was slightly elevated. There is nothing to do for this. I hope he is feeling better.    I spoke to patient and gave him results and he stated he is feeling great!

## 2022-12-26 ENCOUNTER — Other Ambulatory Visit: Payer: Self-pay | Admitting: Internal Medicine

## 2023-02-02 ENCOUNTER — Encounter: Payer: Self-pay | Admitting: Internal Medicine

## 2023-02-02 ENCOUNTER — Ambulatory Visit: Payer: Managed Care, Other (non HMO) | Admitting: Internal Medicine

## 2023-02-02 VITALS — BP 122/80 | HR 71 | Temp 98.3°F | Resp 17 | Ht 76.0 in | Wt 284.0 lb

## 2023-02-02 DIAGNOSIS — R9431 Abnormal electrocardiogram [ECG] [EKG]: Secondary | ICD-10-CM | POA: Diagnosis not present

## 2023-02-02 DIAGNOSIS — R42 Dizziness and giddiness: Secondary | ICD-10-CM | POA: Insufficient documentation

## 2023-02-02 DIAGNOSIS — I4510 Unspecified right bundle-branch block: Secondary | ICD-10-CM

## 2023-02-02 DIAGNOSIS — J0100 Acute maxillary sinusitis, unspecified: Secondary | ICD-10-CM | POA: Diagnosis not present

## 2023-02-02 DIAGNOSIS — I440 Atrioventricular block, first degree: Secondary | ICD-10-CM

## 2023-02-02 MED ORDER — FLUTICASONE PROPIONATE 50 MCG/ACT NA SUSP: 1.0000 | Freq: Two times a day (BID) | NASAL | 0 refills | Status: DC

## 2023-02-02 MED ORDER — AMOXICILLIN-POT CLAVULANATE 875-125 MG PO TABS: 1.0000 | ORAL_TABLET | Freq: Two times a day (BID) | ORAL | 0 refills | Status: DC

## 2023-02-02 NOTE — Assessment & Plan Note (Signed)
He is having vertigo yesterday but it is resolved.  His neuro exam was completely normal.  He has no fluid behind his ears today.  He does have a probable sinus infection.  He used afrin this past week and I asked him to stop this.  We will start on flonase and augmentin.  His sinus infection could be the cause of his vertigo.  If this is not improved in the next 7-10 days, we will send him to vestibular rehab.

## 2023-02-02 NOTE — Progress Notes (Addendum)
Office Visit  Subjective   Patient ID: Todd Powers   DOB: 26-Jul-1970   Age: 52 y.o.   MRN: [id number]   Chief Complaint Chief Complaint  Patient presents with   Acute Visit    Pain in ears     History of Present Illness Todd Powers is a 52 yo male who comes in today for an acute visit due to vertigo.  He was traveling this past week where he had a few seconds of vertigo about 5 days ago. However, yesterday, he had a "full blown vertigo" where he felt the room spinning that occurred while [place] at the airport.  He took dramimine and this lasted about 4 hours and then resolved.  He did take meclizine last night and this morning and his vertigo has not returned.  When he has vertigo, he has no chest pain, palpitations, SOB, syncope or other problems.  He had some fluid behind his left ear a few months ago but his congestion resolved.  However, he has had sinus congestion for the last 3 weeks where he is having yellow nasal discharge with post nasal drip with cough productive of yellow sputum.  There is no hearing loss or tinnitus and no disquilibrium or focal neuro deficit.  They did do an EKG at the airport and this showed some nonspecific t wave inversions.     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,   s/p mechanical thrombectomy ballon angioplasty  and thrombolysis x2  (01-13-2021  pt stated had negative work-up for bleeding order,  and had no previous clot prior and none since 2011)   Hypertension    Right hydrocele    Scoliosis    per pt severe   Thoracic outlet syndrome 11/2009   left subclavian axillary  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, s/p thrombectomy and thrombolysis x2 ;   s/p transaxillary left first rib resection 11/ 2011     Allergies Allergies  Allergen Reactions   Armoracia Rusticana Ext (Horseradish) Nausea And Vomiting   Morphine And Codeine Hives and Nausea And Vomiting   Topamax [Topiramate]    Penicillins Itching and Rash     Medications  Current Outpatient Medications:    amoxicillin-clavulanate (AUGMENTIN) 875-125 MG tablet, Take 1 tablet by mouth 2 (two) times daily., Disp: 20 tablet, Rfl: 0   fluticasone (FLONASE) 50 MCG/ACT nasal spray, Place 1 spray into both nostrils in the morning and at bedtime., Disp: 15.8 mL, Rfl: 0   albuterol (VENTOLIN HFA) 108 (90 Base) MCG/ACT inhaler, TAKE 2 PUFFS BY MOUTH EVERY 6 HOURS AS NEEDED FOR WHEEZE OR SHORTNESS OF BREATH, Disp: 18 each, Rfl: 0   Carboxymethylcellulose Sodium (DRY EYE RELIEF OP), Apply to eye as needed., Disp: , Rfl:    cetirizine (ZYRTEC) 10 MG tablet, Take 10 mg by mouth daily., Disp: , Rfl:    chlorpheniramine-HYDROcodone (TUSSIONEX) 10-8 MG/5ML, Take 5 mLs by mouth every 12 (twelve) hours as needed for cough., Disp: 115 mL, Rfl: 0   hydrochlorothiazide (HYDRODIURIL) 12.5 MG tablet, Take 1 tablet by mouth daily., Disp:  90 tablet, Rfl: 1   hydrochlorothiazide (MICROZIDE) 12.5 MG capsule, Take 1 capsule (12.5 mg total) by mouth daily., Disp: 90 capsule, Rfl: 1   lisinopril (ZESTRIL) 20 MG tablet, Take 1 tablet by mouth daily., Disp: 90 tablet, Rfl: 0   meclizine (ANTIVERT) 50 MG tablet, Take 0.5 tablets (25 mg total) by mouth 3 (three) times daily as needed., Disp: 30 tablet, Rfl: 0   Multiple Vitamins-Minerals (MULTIVITAMIN WITH MINERALS) tablet, Take 1 tablet by mouth daily., Disp: , Rfl:    nabumetone (RELAFEN) 750 MG tablet, Take 1 tablet by mouth twice daily., Disp: 180 tablet, Rfl: 0   Oxymetazoline HCl (MUCINEX SINUS-MAX CLEAR & COOL NA), Place into the nose as  needed., Disp: , Rfl:    pravastatin (PRAVACHOL) 40 MG tablet, TAKE 1 TABLET BY MOUTH EVERYDAY AT BEDTIME, Disp: 90 tablet, Rfl: 1   Review of Systems Review of Systems  Constitutional:  Negative for chills and fever.  HENT:  Positive for congestion and ear pain. Negative for hearing loss, sore throat and tinnitus.        Left ear pain  Respiratory:  Positive for cough and sputum production. Negative for shortness of breath.   Cardiovascular:  Negative for chest pain and leg swelling.  Gastrointestinal:  Negative for abdominal pain, constipation, diarrhea, nausea and vomiting.  Skin:  Negative for itching and rash.  Neurological:  Positive for dizziness. Negative for focal weakness, weakness and headaches.       Objective:    Vitals BP 122/80   Pulse 71   Temp 98.3 F (36.8 C)   Resp 17   Ht 6\' 4"  (1.93 m)   Wt 284 lb (128.8 kg)   SpO2 97%   BMI 34.57 kg/m    Physical Examination Physical Exam Constitutional:      Appearance: Normal appearance. He is not ill-appearing.  HENT:     Right Ear: Tympanic membrane normal.  Cardiovascular:     Rate and Rhythm: Normal rate and regular rhythm.     Pulses: Normal pulses.     Heart sounds: No murmur heard.    No friction rub. No gallop.  Pulmonary:     Effort: Pulmonary effort is normal. No respiratory distress.     Breath sounds: No wheezing, rhonchi or rales.  Abdominal:     General: Abdomen is flat. Bowel sounds are normal. There is no distension.     Palpations: Abdomen is soft.     Tenderness: There is no abdominal tenderness.  Musculoskeletal:     Right lower leg: No edema.     Left lower leg: No edema.  Skin:    General: Skin is warm and dry.     Findings: No rash.  Neurological:     Mental Status: He is alert.        Assessment & Plan:   Acute sinusitis He is having vertigo yesterday but it is resolved.  His neuro exam was completely normal.  He has no fluid behind his ears today.  He does have a probable  sinus infection.  He used afrin this past week and I asked him to stop this.  We will start on flonase and augmentin.  His sinus infection could be the cause of his vertigo.  If this is not improved in the next 7-10 days, we will send him to vestibular rehab.  Abnormal EKG See below.  I reviewed [place] strips from the airport EKG.  RBBB I am going to set him up  to see cardiology.  We will obtain an ECHO.  1st degree AV block His EKG here in the office showed a RBBB with a 1st degree AVB.  DR. Dulce Sellar was called and looked at his EKG from 2022 and there was no change.  The patient will need a zio patch as well.    No follow-ups on file.   Crist Fat, MD

## 2023-02-02 NOTE — Assessment & Plan Note (Signed)
I am going to set him up to see cardiology.  We will obtain an ECHO.

## 2023-02-02 NOTE — Assessment & Plan Note (Signed)
His EKG here in the office showed a RBBB with a 1st degree AVB.  DR. Dulce Sellar was called and looked at his EKG from 2022 and there was no change.  The patient will need a zio patch as well.

## 2023-02-02 NOTE — Addendum Note (Signed)
Addended by: Crist Fat on: 02/02/2023 11:53 AM   Modules accepted: Orders

## 2023-02-02 NOTE — Assessment & Plan Note (Addendum)
See below.  I reviewed his strips from the airport EKG.

## 2023-02-09 ENCOUNTER — Ambulatory Visit: Payer: Managed Care, Other (non HMO)

## 2023-02-10 NOTE — Progress Notes (Unsigned)
Cardiology Office Note:    Date:  02/12/2023   ID:  Todd Powers, DOB 09-25-1970, MRN [id number]  PCP:  Crist Fat, MD  Cardiologist:  Norman Herrlich, MD   Referring MD: Crist Fat, MD  ASSESSMENT:    1. Right bundle branch block (RBBB) with left anterior fascicular block (LAFB)   2. First degree AV block   3. Primary hypertension   4. Mixed hyperlipidemia    PLAN:    In order of problems listed above:  Somewhat unusual to have bifascicular heart block in his age group however he is asymptomatic in terms of syncope and near syncope and usually the rate of progression to pacemaker is quite low in the range of 1% or less per year Echocardiogram to screen for cardiomyopathy infiltrative and event monitor to screen for higher degrees of AV block I asked him to get a smart watch to self monitor at home Avoid rate slowing medications in the future Continue with statin  Next appointment will follow-up as needed if the echocardiogram and event monitors are reassuring.   Medication Adjustments/Labs and Tests Ordered: Current medicines are reviewed at length with the patient today.  Concerns regarding medicines are outlined above.  Orders Placed This Encounter  Procedures   EKG 12-Lead   No orders of the defined types were placed in this encounter.    Chief Complaint  Patient presents with   Dizziness    History of Present Illness:    Todd Powers is a 52 y.o. male who is being seen today for the evaluation of abnormal EKG right bundle branch block first-degree AV block at the request of Crist Fat, MD. 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  Subsequent left trans axial first rib resection 14 2022 with thorascopic decortication evacuation of hematoma for thoracic outlet syndrome complicated with hematoma Recent EKG January 18, 2021 showed sinus rhythm first-degree AV block right bundle branch block left anterior fascicular block.  3 EKGs while at [facility] 2011 unfortunately no reports are available in Care Everywhere.  2 weeks ago as a [place] airport he was having disequilibrium typical symptoms for vertigo was seen in a medical care facility at the airport and was told his EKG was [place].  He saw his PCP and had bifascicular heart block he has had remote EKGs but there is no records available. He has no his heart disease congenital rheumatic or atrial fibrillation He has a history of chronic disequilibrium with features of vertigo as well as tinnitus. He has not had syncope and he does not feel as if he would faint No palpitation syncope chest pain or edema. 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   hospital admission in epic 11-22-2009 dx 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,   s/p mechanical thrombectomy ballon angioplasty  and thrombolysis x2  (01-13-2021  pt stated had negative work-up for bleeding order,  and had no previous clot prior and none since 2011)   Hypertension  Right hydrocele    Scoliosis    per pt severe   Thoracic outlet syndrome 11/2009   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, s/p thrombectomy and thrombolysis x2 ;   s/p transaxillary left first rib resection 11/ 2011    Past Surgical History:  Procedure Laterality Date   ESOPHAGOGASTRODUODENOSCOPY (EGD) WITH ESOPHAGEAL DILATION  2012   HYDROCELE EXCISION Right 01/18/2021   Procedure: HYDROCELECTOMY ADULT;   Surgeon: Noel Christmas, MD;  Location: [facility];  Service: Urology;  Laterality: Right;   LUMBAR LAMINECTOMY/DECOMPRESSION MICRODISCECTOMY  03/06/2017   @[facility] ;  L4--S1   NASAL SEPTUM SURGERY  2018   RIB RESECTION Left 01/31/2010   @[facility] ;  TRANSAXILLERY FIRST RIB RESECTION   VIDEO ASSISTED THORACOSCOPY (VATS)/DECORTICATION Left 02/06/2010   @[facility] ;  and evacuation hematoma    Current Medications: Current Meds  Medication Sig   albuterol (VENTOLIN HFA) 108 (90 Base) MCG/ACT inhaler TAKE 2 PUFFS BY MOUTH EVERY 6 HOURS AS NEEDED FOR WHEEZE OR SHORTNESS OF BREATH (Patient taking differently: Inhale 2 puffs into the lungs every 6 (six) hours as needed for wheezing or shortness of breath.)   amoxicillin-clavulanate (AUGMENTIN) 875-125 MG tablet Take 1 tablet by mouth 2 (two) times daily.   Carboxymethylcellulose Sodium (DRY EYE RELIEF OP) Apply 1 drop to eye as needed (eye dryness).   cetirizine (ZYRTEC) 10 MG tablet Take 10 mg by mouth daily.   fluticasone (FLONASE) 50 MCG/ACT nasal spray Place 1 spray into both nostrils in the morning and at bedtime.   hydrochlorothiazide (HYDRODIURIL) 12.5 MG tablet Take 1 tablet by mouth daily.   lisinopril (ZESTRIL) 20 MG tablet Take 1 tablet by mouth daily.   meclizine (ANTIVERT) 50 MG tablet Take 0.5 tablets (25 mg total) by mouth 3 (three) times daily as needed. (Patient taking differently: Take 25 mg by mouth 3 (three) times daily as needed for dizziness or nausea.)   Multiple Vitamins-Minerals (MULTIVITAMIN WITH MINERALS) tablet Take 1 tablet by mouth daily.   nabumetone (RELAFEN) 750 MG tablet Take 1 tablet by mouth twice daily.   pravastatin (PRAVACHOL) 40 MG tablet TAKE 1 TABLET BY MOUTH EVERYDAY AT BEDTIME (Patient taking differently: Take 40 mg by mouth at bedtime.)   TIRZEPATIDE [place] Inject 32 Units into the skin once a week. Tirzepatine/ L Carnitine   [DISCONTINUED] chlorpheniramine-HYDROcodone (TUSSIONEX) 10-8 MG/5ML Take 5 mLs  by mouth every 12 (twelve) hours as needed for cough.   [DISCONTINUED] hydrochlorothiazide (MICROZIDE) 12.5 MG capsule Take 1 capsule (12.5 mg total) by mouth daily.   [DISCONTINUED] Oxymetazoline HCl (MUCINEX SINUS-MAX CLEAR & COOL NA) Place 1 tablet into the nose as needed (cough).     Allergies:   Armoracia rusticana ext (horseradish) and Morphine and codeine   Social History   Socioeconomic History   Marital status: Married    Spouse name: Not on file   Number of children: Not on file   Years of education: Not on file   Highest education level: Not on file  Occupational History   Not on file  Tobacco Use   Smoking status: Never   Smokeless tobacco: Never  Vaping Use   Vaping status: Never Used  Substance and Sexual Activity   Alcohol use: Yes    Comment: very rare   Drug use: Never   Sexual activity: Not on file    Comment: vasectomy at urologist office  Other Topics Concern   Not on file  Social  History Narrative   Not on file   Social Determinants of Health   Financial Resource Strain: Not on file  Food Insecurity: Not on file  Transportation Needs: Not on file  Physical Activity: Not on file  Stress: Not on file  Social Connections: Not on file     Family History: The patient's family history includes Allergic rhinitis in his daughter and father.  ROS:   ROS Please see the history of present illness.     All other systems reviewed and are negative.  EKGs/Labs/Other Studies Reviewed:    The following studies were reviewed today:  His EKG shows first-degree AV block right bundle branch block left anterior hemiblock there is no infarction pattern.  Recent Labs: 02/13/2022: ALT 36; TSH 2.610 12/21/2022: BUN 19; Creatinine, Ser 1.13; Hemoglobin 16.6; Platelets 314; Potassium 5.0; Sodium 142  Recent Lipid Panel    Component Value Date/Time   CHOL 185 02/13/2022 1158   TRIG 190 (H) 02/13/2022 1158   HDL 40 02/13/2022 1158   CHOLHDL 4.6 02/13/2022 1158    LDLCALC 112 (H) 02/13/2022 1158    Physical Exam:    VS:  BP 112/80 (BP Location: Left Arm, Patient Position: Sitting)   Pulse 87   Ht 6\' 4"  (1.93 m)   Wt 286 lb 3.2 oz (129.8 kg)   SpO2 96%   BMI 34.84 kg/m     Wt Readings from Last 3 Encounters:  02/12/23 286 lb 3.2 oz (129.8 kg)  02/02/23 284 lb (128.8 kg)  12/21/22 284 lb 5 oz (129 kg)     GEN:  Well nourished, well developed in no acute distress HEENT: Normal NECK: No JVD; No carotid bruits LYMPHATICS: No lymphadenopathy CARDIAC: RRR, no murmurs, rubs, gallops RESPIRATORY:  Clear to auscultation without rales, wheezing or rhonchi  ABDOMEN: Soft, non-tender, non-distended MUSCULOSKELETAL:  No edema; No deformity  SKIN: Warm and dry NEUROLOGIC:  Alert and oriented x 3 PSYCHIATRIC:  Normal affect     Signed, Norman Herrlich, MD  02/12/2023 9:14 AM    [facility]

## 2023-02-12 ENCOUNTER — Encounter: Payer: Self-pay | Admitting: Cardiology

## 2023-02-12 ENCOUNTER — Ambulatory Visit: Payer: Managed Care, Other (non HMO) | Attending: Cardiology

## 2023-02-12 ENCOUNTER — Ambulatory Visit: Payer: Managed Care, Other (non HMO) | Attending: Cardiology | Admitting: Cardiology

## 2023-02-12 VITALS — BP 112/80 | HR 87 | Ht 76.0 in | Wt 286.2 lb

## 2023-02-12 DIAGNOSIS — I440 Atrioventricular block, first degree: Secondary | ICD-10-CM

## 2023-02-12 DIAGNOSIS — I452 Bifascicular block: Secondary | ICD-10-CM

## 2023-02-12 DIAGNOSIS — I10 Essential (primary) hypertension: Secondary | ICD-10-CM

## 2023-02-12 DIAGNOSIS — E782 Mixed hyperlipidemia: Secondary | ICD-10-CM | POA: Diagnosis not present

## 2023-02-12 NOTE — Patient Instructions (Addendum)
Medication Instructions:  Your physician recommends that you continue on your current medications as directed. Please refer to the Current Medication list given to you today.  *If you need a refill on your cardiac medications before your next appointment, please call your pharmacy*   Lab Work: None If you have labs (blood work) drawn today and your tests are completely normal, you will receive your results only by: MyChart Message (if you have MyChart) OR A paper copy in the mail If you have any lab test that is abnormal or we need to change your treatment, we will call you to review the results.   Testing/Procedures: A zio monitor was ordered today. It will remain on for 14 days. You will then return monitor and event diary in provided box. It takes 1-2 weeks for report to be downloaded and returned to [place]. We will call you with the results. If monitor falls off or has orange flashing light, please call Zio for further instructions.     Follow-Up: At [facility], you and your health needs are our priority.  As part of our continuing mission to provide you with exceptional heart care, we have created designated Provider Care Teams.  These Care Teams include your primary Cardiologist (physician) and Advanced Practice Providers (APPs -  Physician Assistants and Nurse Practitioners) who all work together to provide you with the care you need, when you need it.  We recommend signing up for the patient portal called "MyChart".  Sign up information is provided on this After Visit Summary.  MyChart is used to connect with patients for Virtual Visits (Telemedicine).  Patients are able to view lab/test results, encounter notes, upcoming appointments, etc.  Non-urgent messages can be sent to your provider as well.   To learn more about what you can do with MyChart, go to [email or web address].    Your next appointment:   Follow up as needed   Provider:   Norman Herrlich, MD    Other  Instructions None   Get an Apple smart watch

## 2023-02-15 ENCOUNTER — Other Ambulatory Visit: Payer: Self-pay | Admitting: Internal Medicine

## 2023-02-22 ENCOUNTER — Other Ambulatory Visit: Payer: Self-pay | Admitting: Internal Medicine

## 2023-02-22 MED ORDER — MECLIZINE HCL 50 MG PO TABS: 25.0000 mg | ORAL_TABLET | Freq: Three times a day (TID) | ORAL | 0 refills | Status: DC | PRN

## 2023-02-23 ENCOUNTER — Other Ambulatory Visit: Payer: Self-pay | Admitting: Internal Medicine

## 2023-02-23 MED ORDER — DOXYCYCLINE MONOHYDRATE 100 MG PO CAPS: 100.0000 mg | ORAL_CAPSULE | Freq: Two times a day (BID) | ORAL | 0 refills | Status: DC

## 2023-02-24 ENCOUNTER — Other Ambulatory Visit: Payer: Self-pay | Admitting: Internal Medicine

## 2023-03-05 ENCOUNTER — Ambulatory Visit: Payer: Managed Care, Other (non HMO) | Attending: Internal Medicine

## 2023-03-05 DIAGNOSIS — I4510 Unspecified right bundle-branch block: Secondary | ICD-10-CM | POA: Diagnosis not present

## 2023-03-05 DIAGNOSIS — I440 Atrioventricular block, first degree: Secondary | ICD-10-CM | POA: Diagnosis not present

## 2023-03-05 LAB — ECHOCARDIOGRAM COMPLETE
P 1/2 time: 512 ms
S' Lateral: 3.2 cm

## 2023-03-08 ENCOUNTER — Encounter: Payer: Self-pay | Admitting: Cardiology

## 2023-03-08 DIAGNOSIS — I7789 Other specified disorders of arteries and arterioles: Secondary | ICD-10-CM

## 2023-03-19 ENCOUNTER — Telehealth (HOSPITAL_BASED_OUTPATIENT_CLINIC_OR_DEPARTMENT_OTHER): Payer: Self-pay | Admitting: Cardiology

## 2023-03-20 ENCOUNTER — Ambulatory Visit: Payer: Managed Care, Other (non HMO) | Admitting: Student

## 2023-03-20 ENCOUNTER — Encounter: Payer: Self-pay | Admitting: Student

## 2023-03-20 VITALS — BP 116/82 | HR 81 | Temp 97.5°F | Resp 20 | Ht 76.0 in | Wt 285.2 lb

## 2023-03-20 DIAGNOSIS — R059 Cough, unspecified: Secondary | ICD-10-CM | POA: Diagnosis not present

## 2023-03-20 DIAGNOSIS — R051 Acute cough: Secondary | ICD-10-CM | POA: Insufficient documentation

## 2023-03-20 DIAGNOSIS — J320 Chronic maxillary sinusitis: Secondary | ICD-10-CM | POA: Insufficient documentation

## 2023-03-20 MED ORDER — PREDNISONE 10 MG PO TABS: ORAL_TABLET | ORAL | 0 refills | Status: DC

## 2023-03-20 MED ORDER — LEVOCETIRIZINE DIHYDROCHLORIDE 5 MG PO TABS: 5.0000 mg | ORAL_TABLET | Freq: Every evening | ORAL | 0 refills | Status: DC

## 2023-03-20 MED ORDER — ALBUTEROL SULFATE HFA 108 (90 BASE) MCG/ACT IN AERS
2.0000 | INHALATION_SPRAY | Freq: Four times a day (QID) | RESPIRATORY_TRACT | 2 refills | Status: DC | PRN
Start: 2023-03-20 — End: 2023-10-09

## 2023-03-20 NOTE — Progress Notes (Signed)
 Acute Office Visit  Subjective:     Patient ID: Todd Powers, male    DOB: 06-May-1970, 52 y.o.   MRN: [id number]  Chief Complaint  Patient presents with   Follow-up    Pt. Has had ongoing sx's since September. Vertigo, congested cough, yellow/bloody mucus, Nasal discharge also yellow with blood in it. Bilateral ear pressure. PND, and scratchy throat, finished 2 courses of antibiotics and started taking Alka seltzer sinus med. Today., No fever.    HPI  Patient is in today for complaints of ongoing congested cough with yellow bloody production, nasal discharge,and  bilateral ear pressure. He has completed 2 courses of antibiotics with his last dose of doxycyline on 03/05/23. He reports that he feels better while on the antibiotics but as soon as he is done the symptoms return. His cough is productive but not harsh, it does not keep him awake at night and he denies orthopnea, shortness of breath, or chest pain. This year he has had 4 sinus infections. For the vertigo, he started vestibular rehab and has had 2 sessions. He reports success but fears that with his sinus flaring up that this could through the progress off. Today he denies recent travel, being around sick persons, no myalgia, fevers or chills. He is using mucinex spray, flonase  and just this morning he started using alka selter    Review of Systems  Constitutional: Negative.   HENT:  Positive for congestion, ear pain and sinus pain.   Eyes: Negative.   Respiratory:  Positive for cough and sputum production. Negative for shortness of breath and wheezing.   Cardiovascular: Negative.   Gastrointestinal: Negative.   Genitourinary: Negative.   Skin: Negative.   Neurological: Negative.         Objective:    BP 116/82 (BP Location: Right Arm, Patient Position: Sitting)   Pulse 81   Temp (!) 97.5 F (36.4 C) (Temporal)   Resp 20   Ht 6\' 4"  (1.93 m)   Wt 285 lb 4 oz (129.4 kg)   SpO2 98%   BMI 34.72 kg/m    Physical  Exam Vitals reviewed.  Constitutional:      Appearance: He is ill-appearing.  HENT:     Head: Normocephalic and atraumatic.     Right Ear: Tympanic membrane, ear canal and external ear normal.     Left Ear: Tympanic membrane, ear canal and external ear normal.     Nose: Congestion and rhinorrhea present.     Mouth/Throat:     Mouth: Mucous membranes are moist.     Pharynx: Oropharynx is clear. Posterior oropharyngeal erythema present.  Eyes:     Conjunctiva/sclera: Conjunctivae normal.     Pupils: Pupils are equal, round, and reactive to light.  Cardiovascular:     Rate and Rhythm: Normal rate and regular rhythm.     Pulses: Normal pulses.     Heart sounds: Normal heart sounds.  Pulmonary:     Effort: Pulmonary effort is normal.     Breath sounds: Normal breath sounds. No wheezing.  Abdominal:     General: Bowel sounds are normal.     Palpations: Abdomen is soft.  Musculoskeletal:     Cervical back: Neck supple. Tenderness present.  Lymphadenopathy:     Cervical: No cervical adenopathy.  Skin:    General: Skin is dry.     Capillary Refill: Capillary refill takes less than 2 seconds.  Neurological:     General: No focal deficit present.  Mental Status: He is alert and oriented to person, place, and time.  Psychiatric:        Mood and Affect: Mood normal.        Behavior: Behavior normal.     No results found for any visits on 03/20/23.      Assessment & Plan:   Problem List Items Addressed This Visit     Chronic maxillary sinusitis - Primary   This is his 4th sinus infection this year and his symptoms return after antibiotics. I feel that it is time to defer to ENT or Allergist, the patient agrees. Referral placed.  He will use taper dose of prednisone , stop certerizine and use xyzal  2.5 mg - 5 mg at bedtime, continue flonase  and otc alka seltzer.       Relevant Medications   predniSONE  (DELTASONE ) 10 MG tablet   levocetirizine (XYZAL  ALLERGY 24HR) 5 MG  tablet   Other Relevant Orders   Ambulatory referral to ENT   Cough in adult   I don't hear any wheezing or decreased breath sounds. I will send albuterol  for if he should have bronchospasm. Instructions provided.       Relevant Medications   albuterol  (VENTOLIN  HFA) 108 (90 Base) MCG/ACT inhaler    Meds ordered this encounter  Medications   predniSONE  (DELTASONE ) 10 MG tablet    Sig: Day 1-4: Take 4 tabs po daily, Day 5-8: take 3 tabs po daily, Day 9-12:  take 2 tabs po daily, Day 13-16: take 1 tab po daily    Dispense:  40 tablet    Refill:  0   albuterol  (VENTOLIN  HFA) 108 (90 Base) MCG/ACT inhaler    Sig: Inhale 2 puffs into the lungs every 6 (six) hours as needed for wheezing or shortness of breath.    Dispense:  8 g    Refill:  2   levocetirizine (XYZAL  ALLERGY 24HR) 5 MG tablet    Sig: Take 1 tablet (5 mg total) by mouth every evening.    Dispense:  30 tablet    Refill:  0    No follow-ups on file.  Nasario Badder, NP

## 2023-03-20 NOTE — Assessment & Plan Note (Signed)
I don't hear any wheezing or decreased breath sounds. I will send albuterol for if he should have bronchospasm. Instructions provided.

## 2023-03-20 NOTE — Assessment & Plan Note (Addendum)
 This is his 4th sinus infection this year and his symptoms return after antibiotics. I feel that it is time to defer to ENT or Allergist, the patient agrees. Referral placed.  He will use taper dose of prednisone , stop certerizine and use xyzal  2.5 mg - 5 mg at bedtime, continue flonase  and otc alka seltzer.

## 2023-03-21 ENCOUNTER — Other Ambulatory Visit: Payer: Self-pay | Admitting: Internal Medicine

## 2023-03-21 MED ORDER — DOXYCYCLINE MONOHYDRATE 100 MG PO CAPS: 100.0000 mg | ORAL_CAPSULE | Freq: Two times a day (BID) | ORAL | 0 refills | Status: DC

## 2023-03-23 ENCOUNTER — Ambulatory Visit (HOSPITAL_BASED_OUTPATIENT_CLINIC_OR_DEPARTMENT_OTHER)
Admission: RE | Admit: 2023-03-23 | Discharge: 2023-03-23 | Disposition: A | Discharge status: Home / Self Care | Payer: Managed Care, Other (non HMO) | Source: Ambulatory Visit | Attending: Cardiology | Admitting: Cardiology

## 2023-03-23 DIAGNOSIS — I7789 Other specified disorders of arteries and arterioles: Secondary | ICD-10-CM | POA: Insufficient documentation

## 2023-03-25 ENCOUNTER — Other Ambulatory Visit: Payer: Self-pay | Admitting: Internal Medicine

## 2023-03-29 ENCOUNTER — Encounter (HOSPITAL_BASED_OUTPATIENT_CLINIC_OR_DEPARTMENT_OTHER): Payer: Self-pay | Admitting: Emergency Medicine

## 2023-03-29 ENCOUNTER — Ambulatory Visit (HOSPITAL_BASED_OUTPATIENT_CLINIC_OR_DEPARTMENT_OTHER)
Admission: EM | Admit: 2023-03-29 | Discharge: 2023-03-29 | Disposition: A | Discharge status: Home / Self Care | Payer: Managed Care, Other (non HMO) | Attending: Family Medicine | Admitting: Family Medicine

## 2023-03-29 DIAGNOSIS — M79645 Pain in left finger(s): Secondary | ICD-10-CM

## 2023-03-29 DIAGNOSIS — S60032A Contusion of left middle finger without damage to nail, initial encounter: Secondary | ICD-10-CM

## 2023-03-29 MED ORDER — HYDROCODONE-ACETAMINOPHEN 5-325 MG PO TABS: 1.0000 | ORAL_TABLET | Freq: Four times a day (QID) | ORAL | 0 refills | Status: DC | PRN

## 2023-03-29 MED ORDER — KETOROLAC TROMETHAMINE 30 MG/ML IJ SOLN
30.0000 mg | Freq: Once | INTRAMUSCULAR | Status: AC
  Administered 2023-03-29: 30 mg via INTRAMUSCULAR

## 2023-03-29 NOTE — Discharge Instructions (Addendum)
 You have been given a shot of Toradol  30 mg today.  Hydrocodone  5 mg--1 tablet every 6 hours as needed for pain.  This is best taken with food.  It can cause sleepiness or dizziness (I did not prescribe Toradol  tablets since you are already taking nabumetone  daily.)  Please go to the [facility] health med center at [facility] to get the x-rays done.  Address is [address] in [place], [place] [address].  We will notify you if there is a broken bone on your x-ray.   Ice and elevate your sore finger  Please follow-up with hand/orthopedic specialist.

## 2023-03-29 NOTE — ED Provider Notes (Addendum)
[facility]    CSN: [id number] Arrival date & time: 03/29/23  1057      History   Chief Complaint Chief Complaint  Patient presents with   left middle finger injury    HPI Todd Powers is a 53 y.o. male.   HPI Here for pain in his left middle finger.  Yesterday when he was putting a canopy on a boat when 2 metal objects crushed the end of his left middle finger.  He has not take any medication for it.  He is allergic to morphine.  No chronic conditions and he does not take blood thinners.   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,   s/p mechanical thrombectomy ballon angioplasty  and thrombolysis x2  (01-13-2021  pt stated had negative work-up for bleeding order,  and had no previous clot prior and none since 2011)   Hypertension    Right hydrocele    Scoliosis    per pt severe   Thoracic outlet syndrome 11/2009   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, s/p thrombectomy and thrombolysis x2 ;   s/p transaxillary left first rib resection 11/ 2011    Patient Active Problem List   Diagnosis Date Noted   Chronic maxillary sinusitis 03/20/2023   Cough in adult 03/20/2023   Vertigo 02/02/2023   Abnormal EKG 02/02/2023   1st degree AV block 02/02/2023   Dizziness 12/21/2022   Reactive airway disease with acute exacerbation 05/05/2022   Snoring 05/21/2018   Allergic rhinoconjunctivitis 12/28/2015    Past Surgical History:  Procedure Laterality Date   ESOPHAGOGASTRODUODENOSCOPY (EGD) WITH ESOPHAGEAL DILATION   2012   HYDROCELE EXCISION Right 01/18/2021   Procedure: HYDROCELECTOMY ADULT;  Surgeon: Elisabeth Valli BIRCH, MD;  Location: [facility];  Service: Urology;  Laterality: Right;   LUMBAR LAMINECTOMY/DECOMPRESSION MICRODISCECTOMY  03/06/2017   @[facility] ;  L4--S1   NASAL SEPTUM SURGERY  2018   RIB RESECTION Left 01/31/2010   @[facility] ;  TRANSAXILLERY FIRST RIB RESECTION   VIDEO ASSISTED THORACOSCOPY (VATS)/DECORTICATION Left 02/06/2010   @[facility] ;  and evacuation hematoma       Home Medications    Prior to Admission medications   Medication Sig Start Date End Date Taking? Authorizing Provider  albuterol  (VENTOLIN  HFA) 108 (90 Base) MCG/ACT inhaler Inhale 2 puffs into the lungs every 6 (six) hours as needed for wheezing or shortness of breath. 03/20/23  Yes Leak, Brandi L, NP  cetirizine (ZYRTEC) 10 MG tablet Take 10 mg by mouth daily.   Yes [provider]  doxycycline  (MONODOX ) 100 MG capsule Take 1 capsule (100 mg total) by mouth 2 (two) times daily. 03/21/23  Yes Fleeta Valeria Mayo, MD  fluticasone  (FLONASE ) 50 MCG/ACT nasal spray PLACE 1 SPRAY INTO BOTH NOSTRILS IN THE MORNING AND AT BEDTIME. 02/26/23  Yes Fleeta Valeria Mayo, MD  hydrochlorothiazide  (HYDRODIURIL ) 12.5 MG tablet Take 1 tablet by mouth daily. 05/08/22  Yes Fleeta Valeria Mayo, MD  HYDROcodone -acetaminophen  (NORCO/VICODIN) 5-325 MG tablet Take 1 tablet by mouth every 6 (six) hours as needed (  pain). 03/29/23  Yes Jalene Lacko, Sharlet POUR, MD  lisinopril  (ZESTRIL ) 20 MG tablet Take 1 tablet by mouth daily. 03/26/23  Yes Fleeta Valeria Mayo, MD  predniSONE  (DELTASONE ) 10 MG tablet Day 1-4: Take 4 tabs po daily, Day 5-8: take 3 tabs po daily, Day 9-12:  take 2 tabs po daily, Day 13-16: take 1 tab po daily 03/20/23  Yes Leak, Brandi L, NP  albuterol  (VENTOLIN  HFA) 108 (90 Base) MCG/ACT inhaler TAKE 2 PUFFS BY MOUTH EVERY 6 HOURS AS NEEDED FOR WHEEZE OR SHORTNESS OF BREATH Patient taking differently: Inhale 2 puffs into the lungs every 6 (six) hours as  needed for wheezing or shortness of breath. 12/18/22   Fleeta Valeria Mayo, MD  Carboxymethylcellulose Sodium (DRY EYE RELIEF OP) Apply 1 drop to eye as needed (eye dryness).    [provider]  hydrochlorothiazide  (MICROZIDE ) 12.5 MG capsule Take 1 capsule by mouth daily. 03/26/23   Fleeta Valeria Mayo, MD  levocetirizine (XYZAL  ALLERGY 24HR) 5 MG tablet Take 1 tablet (5 mg total) by mouth every evening. 03/20/23   Leak, Brandi L, NP  meclizine  (ANTIVERT ) 50 MG tablet Take 0.5 tablets (25 mg total) by mouth 3 (three) times daily as needed. 02/22/23   Fleeta Valeria Mayo, MD  nabumetone  (RELAFEN ) 750 MG tablet Take 1 tablet by mouth twice daily. 03/26/23   Fleeta Valeria Mayo, MD  TIRZEPATIDE  V[place] Inject 32 Units into the skin once a week. Tirzepatine/ L Carnitine    [provider]    Family History Family History  Problem Relation Age of Onset   Allergic rhinitis Father    Allergic rhinitis Daughter     Social History Social History   Tobacco Use   Smoking status: Never   Smokeless tobacco: Never  Vaping Use   Vaping status: Never Used  Substance Use Topics   Alcohol use: Yes    Comment: very rare   Drug use: Never     Allergies   Armoracia rusticana ext (horseradish), Morphine, and Morphine and codeine   Review of Systems Review of Systems   Physical Exam Triage Vital Signs ED Triage Vitals  Encounter Vitals Group     BP 03/29/23 1119 123/79     Systolic BP Percentile --      Diastolic BP Percentile --      Pulse Rate 03/29/23 1119 76     Resp 03/29/23 1119 18     Temp 03/29/23 1119 (!) 97.5 F (36.4 C)     Temp Source 03/29/23 1119 Oral     SpO2 03/29/23 1119 95 %     Weight --      Height --      Head Circumference --      Peak Flow --      Pain Score 03/29/23 1117 8     Pain Loc --      Pain Education --      Exclude from Growth Chart --    No data found.  Updated Vital Signs BP 123/79 (BP Location: Right Arm)   Pulse 76   Temp (!) 97.5 F (36.4 C)  (Oral)   Resp 18   SpO2 95%   Visual Acuity Right Eye Distance:   Left Eye Distance:   Bilateral Distance:    Right Eye Near:   Left Eye Near:    Bilateral Near:     Physical Exam Vitals reviewed.  Constitutional:      General: He is not in acute distress.  Appearance: He is not ill-appearing, toxic-appearing or diaphoretic.  Musculoskeletal:     Comments: There is pronounced swelling and ecchymosis of the finger pad of the left middle finger.  There is a linear abrasion about half a centimeter in length and it is shallow, on the dorsum of the finger near the proximal nail fold.  It is not actively bleeding.  There is a little dried blood on the proximal nail fold and there is some blood visible in the proximal one quarter of the fingernail on the middle finger.  There is no discharge from the abrasion or the nail fold.  Skin:    Coloration: Skin is not pale.  Neurological:     Mental Status: He is alert.      UC Treatments / Results  Labs (all labs ordered are listed, but only abnormal results are displayed) Labs Reviewed - No data to display  EKG   Radiology No results found.  Procedures Procedures (including critical care time)  Medications Ordered in UC Medications  ketorolac  (TORADOL ) 30 MG/ML injection 30 mg (30 mg Intramuscular Given 03/29/23 1153)    Initial Impression / Assessment and Plan / UC Course  I have reviewed the triage vital signs and the nursing notes.  Pertinent labs & imaging results that were available during my care of the patient were reviewed by me and considered in my medical decision making (see chart for details).    We do not have x-ray in the building.  He is given a x-ray order for outpatient imaging.  We will notify him if it is abnormal.  He is given contact information for orthopedics.  At this time by exam, I do not think he needs any kind of drainage procedure for his fingernail and the subungual hematoma, as it is drained  out from the nail fold.  I recommended ice and elevate the finger in a splint is applied here for comfort. Toradol  injection is given here.  I did hydrocodone  on a prescription.  He is taking the nabumetone  already so I did not want to add extra NSAIDs to his at home medications.  Late note, 04/03/2023-- pt went for xrays 1/10, showed fracture of the middle part of the distal phalanx. I can see that he has already seen orthopedics. Final Clinical Impressions(s) / UC Diagnoses   Final diagnoses:  Pain of left middle finger  Hematoma of left middle finger     Discharge Instructions      You have been given a shot of Toradol  30 mg today.  Hydrocodone  5 mg--1 tablet every 6 hours as needed for pain.  This is best taken with food.  It can cause sleepiness or dizziness (I did not prescribe Toradol  tablets since you are already taking nabumetone  daily.)  Please go to the [facility] health med center at [facility] to get the x-rays done.  Address is [address] in [place], [place] [address].  We will notify you if there is a broken bone on your x-ray.   Ice and elevate your sore finger  Please follow-up with hand/orthopedic specialist.    ED Prescriptions     Medication Sig Dispense Auth. Provider   HYDROcodone -acetaminophen  (NORCO/VICODIN) 5-325 MG tablet Take 1 tablet by mouth every 6 (six) hours as needed (pain). 12 tablet Ziyon Soltau K, MD      I have reviewed the PDMP during this encounter.   Vonna Sharlet POUR, MD 03/29/23 1202    Vonna Sharlet POUR, MD  04/03/23 0821  

## 2023-03-29 NOTE — ED Triage Notes (Signed)
 Pt has trying to lay his canopy to his pontoon down and it slammed down pinning his finger between metal pieces on his left middle finger. Finger is bruised and swollen.

## 2023-03-30 ENCOUNTER — Telehealth (HOSPITAL_BASED_OUTPATIENT_CLINIC_OR_DEPARTMENT_OTHER): Payer: Self-pay | Admitting: Emergency Medicine

## 2023-03-30 ENCOUNTER — Ambulatory Visit (HOSPITAL_BASED_OUTPATIENT_CLINIC_OR_DEPARTMENT_OTHER)
Admission: RE | Admit: 2023-03-30 | Discharge: 2023-03-30 | Disposition: A | Discharge status: Home / Self Care | Payer: Managed Care, Other (non HMO) | Source: Ambulatory Visit | Attending: Family Medicine

## 2023-03-30 DIAGNOSIS — S6710XA Crushing injury of unspecified finger(s), initial encounter: Secondary | ICD-10-CM | POA: Insufficient documentation

## 2023-03-30 DIAGNOSIS — M79645 Pain in left finger(s): Secondary | ICD-10-CM | POA: Diagnosis present

## 2023-03-30 DIAGNOSIS — W230XXA Caught, crushed, jammed, or pinched between moving objects, initial encounter: Secondary | ICD-10-CM | POA: Diagnosis not present

## 2023-03-30 NOTE — Telephone Encounter (Signed)
 Patient walked into urgent care and stated he had the x-ray done yesterday and that he seen a PA that works at a hand specialist office and they told him that he needed to go to ortho care at [facility] and have it cleaned by a MD that specialized in hands pt arrived there and they were closed and he was wondering what to do I advised him to call the PA for another recommendations of a hand specialist. Pt verbalized understanding.

## 2023-04-02 ENCOUNTER — Other Ambulatory Visit: Payer: Self-pay

## 2023-04-02 DIAGNOSIS — I7789 Other specified disorders of arteries and arterioles: Secondary | ICD-10-CM

## 2023-04-03 ENCOUNTER — Ambulatory Visit (INDEPENDENT_AMBULATORY_CARE_PROVIDER_SITE_OTHER): Payer: Managed Care, Other (non HMO) | Admitting: Orthopedic Surgery

## 2023-04-03 DIAGNOSIS — S6992XA Unspecified injury of left wrist, hand and finger(s), initial encounter: Secondary | ICD-10-CM

## 2023-04-03 DIAGNOSIS — S62663A Nondisplaced fracture of distal phalanx of left middle finger, initial encounter for closed fracture: Secondary | ICD-10-CM | POA: Diagnosis not present

## 2023-04-03 NOTE — Progress Notes (Signed)
 Todd Powers - 53 y.o. male MRN [id number]  Date of birth: 1970/04/09  Office Visit Note: Visit Date: 04/03/2023 PCP: Fleeta Valeria Mayo, MD Referred by: Fleeta Valeria Mayo, MD  Subjective: No chief complaint on file.  HPI: Todd Powers is a pleasant 53 y.o. male who presents today for evaluation of a left long finger crush injury sustained 5 days prior.  He was seen in a local urgent care and underwent x-rays which demonstrated to distal phalanx fracture with minimal displacement.  There is also evidence of a nailbed injury with subungual hematoma.  He has been placed to a splint and given close orthopedic hand follow-up.  Pertinent ROS were reviewed with the patient and found to be negative unless otherwise specified above in HPI.   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  Plan: Extensive discussion was had with the patient today regarding his left long finger injury.  Reviewed the results of his prior radiographs which do show a minimally displaced distal phalanx fracture.  My concern is the potential for a nailbed injury in the setting of his ongoing subungual hematoma.  We discussed conservative versus surgical treatment modalities.  From a conservative standpoint we discussed ongoing splinting, however without appropriate look at the nailbed it is difficult discern if there is true nailbed injury warranting repair.  We discussed the possibility for ongoing pain in this region and potential nail deformity if this is not appropriately addressed.  From a surgical standpoint, we discussed nail plate removal and potential underlying nailbed repair.  This could be performed under local  anesthesia.  Risks and benefits of the procedure were discussed, risks including but not limited to infection, bleeding, scarring, stiffness, nerve injury, tendon injury, vascular injury, recurrence of symptoms, persistent nail deformity and need for subsequent operation.  Patient expressed understanding.  He would like to proceed with left long finger nail plate removal and possible nailbed repair at the next available date.  This will be performed under local anesthesia.    Follow-up: No follow-ups on file.   Meds & Orders: No orders of the defined types were placed in this encounter.  No orders of the defined types were placed in this encounter.    Procedures: No procedures performed      Clinical History: No specialty comments available.  He reports that he has never smoked. He has never used smokeless tobacco. No results for input(s): HGBA1C, LABURIC in the last 8760 hours.  Objective:   Vital Signs: There were no vitals taken for this visit.  Physical Exam  Gen: Well-appearing, in no acute distress; non-toxic CV: Regular Rate. Well-perfused. Warm.  Resp: Breathing unlabored on room air; no wheezing. Psych: Fluid speech in conversation; appropriate affect; normal thought process  Ortho Exam Left long finger: - Notable subungual hematoma underneath the nail plate, nail plate is well-fixed beneath the eponychial fold, no evidence of avulsion or loosening - Able to perform range of motion at the PIP and DIP without significant restriction - Associated tenderness at the distal phalanx consistent with the radiographic workup - Sensation is intact distally  Imaging: Prior x-rays reviewed which show fracture of the midportion of the distal phalanx without intra-articular extension, no significant fracture displacement  Past Medical/Family/Surgical/Social History: Medications & Allergies reviewed  per EMR, new medications updated. Patient Active Problem List   Diagnosis Date  Noted   Chronic maxillary sinusitis 03/20/2023   Cough in adult 03/20/2023   Vertigo 02/02/2023   Abnormal EKG 02/02/2023   1st degree AV block 02/02/2023   Dizziness 12/21/2022   Reactive airway disease with acute exacerbation 05/05/2022   Snoring 05/21/2018   Allergic rhinoconjunctivitis 12/28/2015   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,   s/p mechanical thrombectomy ballon angioplasty  and thrombolysis x2  (01-13-2021  pt stated had negative work-up for bleeding order,  and had no previous clot prior and none since 2011)   Hypertension    Right hydrocele    Scoliosis    per pt severe   Thoracic outlet syndrome 11/2009   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, s/p thrombectomy and thrombolysis x2 ;   s/p transaxillary left first rib resection 11/ 2011   Family History  Problem Relation Age of Onset   Allergic rhinitis Father    Allergic rhinitis Daughter    Past Surgical History:  Procedure Laterality Date   ESOPHAGOGASTRODUODENOSCOPY (EGD) WITH ESOPHAGEAL DILATION  2012   HYDROCELE EXCISION Right 01/18/2021   Procedure: HYDROCELECTOMY ADULT;  Surgeon: Elisabeth Valli BIRCH, MD;  Location: [facility];  Service: Urology;  Laterality: Right;   LUMBAR LAMINECTOMY/DECOMPRESSION MICRODISCECTOMY  03/06/2017   @[facility] ;  L4--S1   NASAL SEPTUM SURGERY  2018   RIB RESECTION Left 01/31/2010   @[facility] ;  TRANSAXILLERY FIRST RIB RESECTION   VIDEO ASSISTED THORACOSCOPY (VATS)/DECORTICATION Left 02/06/2010   @[facility] ;  and evacuation  hematoma   Social History   Occupational History   Not on file  Tobacco Use   Smoking status: Never   Smokeless tobacco: Never  Vaping Use   Vaping status: Never Used  Substance and Sexual Activity   Alcohol use: Yes    Comment: very rare   Drug use: Never   Sexual activity: Not on file    Comment: vasectomy at urologist office    Garin Mata Estela) Arlinda, M.D. [facility] 9:46 PM

## 2023-04-04 ENCOUNTER — Telehealth: Payer: Self-pay | Admitting: Orthopedic Surgery

## 2023-04-04 NOTE — Telephone Encounter (Signed)
 Spoke with patient.  Informed him this was fine

## 2023-04-04 NOTE — Telephone Encounter (Signed)
 Patient called advised he is still taking Doxycycline . Patient said he took one today and he is schedule for surgery tomorrow. The number to contract patient is [phone]

## 2023-04-05 DIAGNOSIS — S62663A Nondisplaced fracture of distal phalanx of left middle finger, initial encounter for closed fracture: Secondary | ICD-10-CM | POA: Diagnosis not present

## 2023-04-11 ENCOUNTER — Ambulatory Visit: Payer: Managed Care, Other (non HMO) | Admitting: Orthopedic Surgery

## 2023-04-11 ENCOUNTER — Other Ambulatory Visit (INDEPENDENT_AMBULATORY_CARE_PROVIDER_SITE_OTHER): Payer: Self-pay

## 2023-04-11 DIAGNOSIS — S6992XA Unspecified injury of left wrist, hand and finger(s), initial encounter: Secondary | ICD-10-CM | POA: Diagnosis not present

## 2023-04-11 DIAGNOSIS — S62663A Nondisplaced fracture of distal phalanx of left middle finger, initial encounter for closed fracture: Secondary | ICD-10-CM | POA: Diagnosis not present

## 2023-04-11 NOTE — Progress Notes (Signed)
   Todd Powers - 53 y.o. male MRN [id number]  Date of birth: [birth year]  Office Visit Note: Visit Date: 04/11/2023 PCP: Crist Fat, MD Referred by: Crist Fat, MD  Subjective:  HPI: Todd Powers is a 53 y.o. male who presents today for follow up 1 week status post left long finger nail plate removal, nailbed repair.  He is doing well overall, pain is well-controlled.  Pertinent ROS were reviewed with the patient and found to be negative unless otherwise specified above in HPI.   Assessment & Plan: Visit Diagnoses:  1. Injury of nail bed of finger of left hand, initial encounter   2. Closed nondisplaced fracture of distal phalanx of left middle finger, initial encounter     Plan: He is doing very well postoperatively.  X-rays reviewed today which show stable appearance of the distal phalanx fracture.  Nailbed repair is intact with appropriate stabilization of nail plate.  Mallet splint applied today.  Okay for washing the hand with soapy water at this point.  Return in 2 weeks for wound check and suture removal to allow the new nail to grow in.  Follow-up: No follow-ups on file.   Meds & Orders: No orders of the defined types were placed in this encounter.   Orders Placed This Encounter  Procedures   XR Finger Middle Left     Procedures: No procedures performed       Objective:   Vital Signs: There were no vitals taken for this visit.  Ortho Exam Left long finger: - Nail plate was secured beneath the eponychial fold, nylon sutures in place - Will perform gentle range of motion at the DIP and PIP without significant pain - Hand remains warm well-perfused, long finger with appropriate capillary refill and sensation distally  Imaging: XR Finger Middle Left Result Date: 04/11/2023 X-rays of the left long finger were obtained today, x-rays demonstrate stable appearance of the distal phalanx fracture without residual displacement.  DIP remains congruent in all  planes.    Broderick Fonseca Trevor Mace, M.D. [facility] 10:17 AM

## 2023-04-19 ENCOUNTER — Encounter: Payer: Managed Care, Other (non HMO) | Admitting: Orthopedic Surgery

## 2023-04-23 ENCOUNTER — Ambulatory Visit: Payer: Managed Care, Other (non HMO) | Admitting: Internal Medicine

## 2023-04-23 ENCOUNTER — Encounter: Payer: Self-pay | Admitting: Internal Medicine

## 2023-04-23 VITALS — BP 122/74 | HR 117 | Temp 97.9°F | Ht 76.0 in | Wt 282.3 lb

## 2023-04-23 DIAGNOSIS — R1084 Generalized abdominal pain: Secondary | ICD-10-CM | POA: Diagnosis not present

## 2023-04-23 LAB — POCT URINALYSIS DIPSTICK
Blood, UA: NEGATIVE
Glucose, UA: NEGATIVE
Ketones, UA: NEGATIVE
Leukocytes, UA: NEGATIVE
Nitrite, UA: NEGATIVE
Protein, UA: POSITIVE — AB
Spec Grav, UA: 1.02 (ref 1.010–1.025)
Urobilinogen, UA: 0.2 U/dL
pH, UA: 6 (ref 5.0–8.0)

## 2023-04-23 NOTE — Addendum Note (Signed)
Addended by: Christianne Dolin on: 04/23/2023 09:38 AM   Modules accepted: Orders

## 2023-04-23 NOTE — Progress Notes (Signed)
Office Visit  Subjective   Patient ID: Todd Powers   DOB: 1970/04/06   Age: 53 y.o.   MRN: [id number]   Chief Complaint Chief Complaint  Patient presents with   Acute Visit    Stomach pain since Tuesday of last week, losse stools, no fever     History of Present Illness Todd Powers is 53 yo male who last week began having diarrhea about 6 days ago while he was in [place].  He was taking immodium AD and was having 3-4 liquid BM's per day.  He states one time he had blood but there has been no continuous blood or any mucus.  He continued to have immodium AD and tried to eat 2 days later.  He ate hamburger sliders and then began having abdominal pain in his lower quadrants and he continued with diarrhea.  There was no nausea or vomiting.  He states he had a lot of cramping the next few days over the weekend and he felt malaise and did not get up much.  He has not been eating well but he has been drinking gatoraide.  The patient denies any fevers, or chills, nausea, vomiting, rash or other problems.       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,   s/p mechanical thrombectomy ballon angioplasty  and thrombolysis x2  (01-13-2021  pt stated had negative work-up for bleeding order,  and had no previous clot prior and none since 2011)   Hypertension    Right hydrocele    Scoliosis    per pt severe   Thoracic outlet syndrome 11/2009   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, s/p thrombectomy and thrombolysis x2 ;   s/p  transaxillary left first rib resection 11/ 2011     Allergies Allergies  Allergen Reactions   Armoracia Rusticana Ext (Horseradish) Nausea And Vomiting   Morphine Hives and Nausea And Vomiting   Morphine And Codeine Hives and Nausea And Vomiting     Medications  Current Outpatient Medications:    albuterol (VENTOLIN HFA) 108 (90 Base) MCG/ACT inhaler, TAKE 2 PUFFS BY MOUTH EVERY 6 HOURS AS NEEDED FOR WHEEZE OR SHORTNESS OF BREATH (Patient taking differently: Inhale 2 puffs into the lungs every 6 (six) hours as needed for wheezing or shortness of breath.), Disp: 18 each, Rfl: 0   albuterol (VENTOLIN HFA) 108 (90 Base) MCG/ACT inhaler, Inhale 2 puffs into the lungs every 6 (six) hours as needed for wheezing or shortness of breath., Disp: 8 g, Rfl: 2   Carboxymethylcellulose Sodium (DRY EYE RELIEF OP), Apply 1 drop to eye as needed (eye dryness)., Disp: , Rfl:    cetirizine (ZYRTEC) 10 MG tablet, Take 10 mg by mouth daily., Disp: , Rfl:    fluticasone (FLONASE) 50 MCG/ACT nasal spray, PLACE 1 SPRAY INTO BOTH NOSTRILS IN THE MORNING AND AT BEDTIME., Disp: 48 mL, Rfl: 1   hydrochlorothiazide (HYDRODIURIL) 12.5 MG tablet, Take 1 tablet by mouth daily., Disp: 90 tablet, Rfl: 1   hydrochlorothiazide (MICROZIDE)  12.5 MG capsule, Take 1 capsule by mouth daily., Disp: 90 capsule, Rfl: 0   HYDROcodone-acetaminophen (NORCO/VICODIN) 5-325 MG tablet, Take 1 tablet by mouth every 6 (six) hours as needed (pain)., Disp: 12 tablet, Rfl: 0   levocetirizine (XYZAL ALLERGY 24HR) 5 MG tablet, Take 1 tablet (5 mg total) by mouth every evening., Disp: 30 tablet, Rfl: 0   lisinopril (ZESTRIL) 20 MG tablet, Take 1 tablet by mouth daily., Disp: 90 tablet, Rfl: 0   meclizine (ANTIVERT) 50 MG tablet, Take 0.5 tablets (25 mg total) by mouth 3 (three) times daily as needed., Disp: 30 tablet, Rfl: 0   nabumetone (RELAFEN) 750 MG tablet, Take 1 tablet by mouth twice daily., Disp: 180 tablet, Rfl: 0   predniSONE (DELTASONE)  10 MG tablet, Day 1-4: Take 4 tabs po daily, Day 5-8: take 3 tabs po daily, Day 9-12:  take 2 tabs po daily, Day 13-16: take 1 tab po daily, Disp: 40 tablet, Rfl: 0   TIRZEPATIDE [place], Inject 32 Units into the skin once a week. Tirzepatine/ L Carnitine, Disp: , Rfl:    Review of Systems Review of Systems  Constitutional:  Negative for chills and fever.  Respiratory:  Negative for cough and shortness of breath.   Gastrointestinal:  Positive for abdominal pain and diarrhea. Negative for blood in stool, heartburn, melena, nausea and vomiting.  Genitourinary:  Negative for dysuria and frequency.  Musculoskeletal:  Negative for myalgias.  Skin:  Negative for itching and rash.  Neurological:  Positive for weakness. Negative for dizziness and headaches.       Objective:    Vitals BP 122/74   Pulse (!) 117   Temp 97.9 F (36.6 C)   Ht 6\' 4"  (1.93 m)   Wt 282 lb 4.8 oz (128.1 kg)   SpO2 99%   BMI 34.36 kg/m    Physical Examination Physical Exam Constitutional:      Appearance: Normal appearance. He is not ill-appearing.  Cardiovascular:     Rate and Rhythm: Normal rate and regular rhythm.     Pulses: Normal pulses.     Heart sounds: No murmur heard.    No friction rub. No gallop.  Pulmonary:     Effort: Pulmonary effort is normal. No respiratory distress.     Breath sounds: No wheezing, rhonchi or rales.  Abdominal:     General: Abdomen is flat. Bowel sounds are normal. There is no distension.     Palpations: Abdomen is soft.     Tenderness: There is abdominal tenderness.     Comments: Patient had tenderness in his lower quadrants as well as his epigastric area,  No guarding  Musculoskeletal:     Right lower leg: No edema.     Left lower leg: No edema.  Skin:    General: Skin is warm and dry.     Findings: No rash.  Neurological:     Mental Status: He is alert.        Assessment & Plan:   Generalized abdominal pain He has abdominal pain with diarrhea that has been  going on for almost 1 week.  He was told by Dr. Dulce Sellar that he saw gallstones on his ECHO.  He has some discomfort in his RUQ at this time as well as tenderness in his epigastric area and lower quadrants.  I am going to obtain a CBC, CMP and get a CT scan of his abd/pelvis.  Further care depends on these results.    No follow-ups  on file.   Crist Fat, MD

## 2023-04-23 NOTE — Assessment & Plan Note (Signed)
He has abdominal pain with diarrhea that has been going on for almost 1 week.  He was told by Dr. Dulce Sellar that he saw gallstones on his ECHO.  He has some discomfort in his RUQ at this time as well as tenderness in his epigastric area and lower quadrants.  I am going to obtain a CBC, CMP and get a CT scan of his abd/pelvis.  Further care depends on these results.

## 2023-04-26 ENCOUNTER — Other Ambulatory Visit (HOSPITAL_BASED_OUTPATIENT_CLINIC_OR_DEPARTMENT_OTHER): Payer: Managed Care, Other (non HMO) | Admitting: Radiology

## 2023-04-26 ENCOUNTER — Ambulatory Visit: Payer: Managed Care, Other (non HMO) | Admitting: Orthopedic Surgery

## 2023-04-26 DIAGNOSIS — S6992XA Unspecified injury of left wrist, hand and finger(s), initial encounter: Secondary | ICD-10-CM

## 2023-04-26 NOTE — Progress Notes (Signed)
   Todd Powers - 53 y.o. male MRN [id number]  Date of birth: [birth year]  Office Visit Note: Visit Date: 04/26/2023 PCP: Fleeta Valeria Mayo, MD Referred by: Fleeta Valeria Mayo, MD  Subjective:  HPI: Todd Powers is a 53 y.o. male who presents today for follow up 3 weeks status post left long finger nail plate removal, nailbed repair.  Also with associated distal phalanx fracture being treated conservatively.  He is doing very well, pain is controlled.  Pertinent ROS were reviewed with the patient and found to be negative unless otherwise specified above in HPI.   Assessment & Plan: Visit Diagnoses: No diagnosis found.  Plan: Sutures were removed today from the nail plate underneath the eponychial fold of the long finger.  I explained that the new nail will continue to grow and will slowly push the previous nail plate distally.  Follow-up in approximately 3 weeks for repeat clinical check, at that juncture, we will likely take radiographs of the finger to ensure appropriate healing of the distal phalanx fracture.  Follow-up: No follow-ups on file.   Meds & Orders: No orders of the defined types were placed in this encounter.  No orders of the defined types were placed in this encounter.    Procedures: No procedures performed       Objective:   Vital Signs: There were no vitals taken for this visit.  Ortho Exam Left long finger: - Nail plate remains secure beneath the eponychial fold, nylon sutures removed - No significant tenderness at the distal phalanx - Will perform gentle range of motion at the DIP and PIP without significant pain - Hand remains warm well-perfused, long finger with appropriate capillary refill and sensation distally  Imaging: No results found.   Pride Gonzales Afton Alderton, M.D. [facility] 9:22 AM

## 2023-04-30 ENCOUNTER — Encounter: Payer: Self-pay | Admitting: Internal Medicine

## 2023-05-08 ENCOUNTER — Encounter: Payer: Managed Care, Other (non HMO) | Admitting: Rehabilitative and Restorative Service Providers"

## 2023-05-24 ENCOUNTER — Ambulatory Visit: Payer: Managed Care, Other (non HMO) | Admitting: Orthopedic Surgery

## 2023-05-24 ENCOUNTER — Other Ambulatory Visit (INDEPENDENT_AMBULATORY_CARE_PROVIDER_SITE_OTHER): Payer: Self-pay

## 2023-05-24 DIAGNOSIS — S62663A Nondisplaced fracture of distal phalanx of left middle finger, initial encounter for closed fracture: Secondary | ICD-10-CM

## 2023-05-24 NOTE — Progress Notes (Signed)
   Gwenette Greet - 53 y.o. male MRN [id number]  Date of birth: 1970-04-01  Office Visit Note: Visit Date: 05/24/2023 PCP: Crist Fat, MD Referred by: Crist Fat, MD  Subjective:  HPI: Creedence Kunesh is a 53 y.o. male who presents today for follow up 6 weeks status post left long finger nail plate removal, nailbed repair.  Also with associated distal phalanx fracture being treated conservatively.  He is doing very well, pain is controlled.  Pertinent ROS were reviewed with the patient and found to be negative unless otherwise specified above in HPI.   Assessment & Plan: Visit Diagnoses:  1. Closed nondisplaced fracture of distal phalanx of left middle finger, initial encounter     Plan: He is doing well and demonstrating appropriate healing clinically.  Radiographs obtained today do show distal phalanx fracture with some lucency, however he does not have any significant tenderness on examination today.  New nail does appear to be exhibiting growth beneath the eponychial fold, did once again explained that this will take months for full resolution and full nail growth if it is able to be achieved.  Expressed full understanding.  At this juncture, he would like to return as needed which is appropriate.  He is cleared for all activities without restriction.  Follow-up: No follow-ups on file.   Meds & Orders: No orders of the defined types were placed in this encounter.   Orders Placed This Encounter  Procedures   XR Finger Middle Left     Procedures: No procedures performed       Objective:   Vital Signs: There were no vitals taken for this visit.  Ortho Exam Left long finger: - New nail growth is visible beneath the eponychial fold - No significant tenderness at the distal phalanx - Composite fist without restriction - Hand remains warm well-perfused, long finger with appropriate capillary refill and sensation distally  Imaging: XR Finger Middle Left Result Date:  05/24/2023 X-rays of the left long finger, also views were obtained today X-rays demonstrate previously known distal phalanx fracture, there is mild lucency notable at the distal phalanx, no residual displacement.  DIP joint remains well located in all planes.    Alhaji Mcneal Trevor Mace, M.D. [facility] 9:58 AM

## 2023-06-29 ENCOUNTER — Ambulatory Visit: Admitting: Internal Medicine

## 2023-06-29 ENCOUNTER — Encounter: Payer: Self-pay | Admitting: Internal Medicine

## 2023-06-29 VITALS — BP 134/84 | HR 78 | Temp 98.5°F | Resp 18 | Ht 76.0 in | Wt 297.6 lb

## 2023-06-29 DIAGNOSIS — Z6836 Body mass index (BMI) 36.0-36.9, adult: Secondary | ICD-10-CM | POA: Insufficient documentation

## 2023-06-29 MED ORDER — MOUNJARO 2.5 MG/0.5ML ~~LOC~~ SOAJ
2.5000 mg | SUBCUTANEOUS | Status: DC
Start: 2023-06-29 — End: 2023-08-16

## 2023-06-29 MED ORDER — SHINGRIX 50 MCG/0.5ML IM SUSR: 0.5000 mL | Freq: Once | INTRAMUSCULAR | 0 refills | Status: AC

## 2023-06-29 NOTE — Addendum Note (Signed)
 Addended by: Crist Fat on: 06/29/2023 03:39 PM   Modules accepted: Orders

## 2023-06-29 NOTE — Progress Notes (Signed)
 Office Visit  Subjective   Patient ID: Todd Powers   DOB: September 22, 1970   Age: 53 y.o.   MRN: [id number]   Chief Complaint Chief Complaint  Patient presents with   Follow-up    Weight Loss F/U     History of Present Illness Todd Powers ia a 53 yo male who returns today for weight loss management.  In the past we had him on wegovy but that was 2 years ago.  He denies any side effects from wegovy. He has been working outside for exercise.  The patient is starting to watch his diet.  He has cut back on his sweet tea and Dr. Reino Kent. He has been on Adipex in the past.   He has been on Topamax in the past but had to stop this where he had side effects of a rash.   He has failed belviq in the past.  I also referred him to the [facility] and he was told he is not a candidate for any surgery.         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,   s/p mechanical thrombectomy ballon angioplasty  and thrombolysis x2  (01-13-2021  pt stated had negative work-up for bleeding order,  and had no previous clot prior and none since 2011)   Hypertension    Right hydrocele    Scoliosis    per pt severe   Thoracic outlet syndrome 11/2009   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, s/p thrombectomy and thrombolysis x2 ;   s/p transaxillary left first rib resection 11/ 2011     Allergies Allergies  Allergen Reactions   Morphine Hives and Nausea And Vomiting   Morphine And Codeine Hives and Nausea And Vomiting      Medications  Current Outpatient Medications:    albuterol (VENTOLIN HFA) 108 (90 Base) MCG/ACT inhaler, TAKE 2 PUFFS BY MOUTH EVERY 6 HOURS AS NEEDED FOR WHEEZE OR SHORTNESS OF BREATH (Patient taking differently: Inhale 2 puffs into the lungs every 6 (six) hours as needed for wheezing or shortness of breath.), Disp: 18 each, Rfl: 0   albuterol (VENTOLIN HFA) 108 (90 Base) MCG/ACT inhaler, Inhale 2 puffs into the lungs every 6 (six) hours as needed for wheezing or shortness of breath., Disp: 8 g, Rfl: 2   Carboxymethylcellulose Sodium (DRY EYE RELIEF OP), Apply 1 drop to eye as needed (eye dryness)., Disp: , Rfl:    cetirizine (ZYRTEC) 10 MG tablet, Take 10 mg by mouth daily., Disp: , Rfl:    fluticasone (FLONASE) 50 MCG/ACT nasal spray, PLACE 1 SPRAY INTO BOTH NOSTRILS IN THE MORNING AND AT BEDTIME., Disp: 48 mL, Rfl: 1   hydrochlorothiazide (HYDRODIURIL) 12.5 MG tablet, Take 1 tablet by mouth daily., Disp: 90 tablet, Rfl: 1   hydrochlorothiazide (MICROZIDE) 12.5 MG capsule, Take 1 capsule by mouth daily., Disp: 90 capsule, Rfl: 0   levocetirizine (XYZAL ALLERGY 24HR) 5 MG tablet, Take 1 tablet (5 mg total) by mouth every evening., Disp: 30 tablet, Rfl: 0  lisinopril (ZESTRIL) 20 MG tablet, Take 1 tablet by mouth daily., Disp: 90 tablet, Rfl: 0   nabumetone (RELAFEN) 750 MG tablet, Take 1 tablet by mouth twice daily., Disp: 180 tablet, Rfl: 0   TIRZEPATIDE [place], Inject 32 Units into the skin once a week. Tirzepatine/ L Carnitine, Disp: , Rfl:    Review of Systems Review of Systems  Constitutional:  Negative for chills and fever.  Eyes:  Negative for blurred vision and double vision.  Respiratory:  Negative for shortness of breath.   Cardiovascular:  Negative for chest pain and palpitations.  Gastrointestinal:  Negative for abdominal pain, constipation, diarrhea, heartburn, nausea and vomiting.  Neurological:  Negative for dizziness, weakness and headaches.       Objective:     Vitals BP 134/84   Pulse 78   Temp 98.5 F (36.9 C) (Oral)   Resp 18   Ht 6\' 4"  (1.93 m)   Wt 297 lb 9.6 oz (135 kg)   SpO2 97%   BMI 36.23 kg/m    Physical Examination Physical Exam Constitutional:      Appearance: Normal appearance. He is not ill-appearing.  Cardiovascular:     Rate and Rhythm: Normal rate and regular rhythm.     Pulses: Normal pulses.     Heart sounds: No murmur heard.    No friction rub. No gallop.  Pulmonary:     Effort: Pulmonary effort is normal. No respiratory distress.     Breath sounds: No wheezing, rhonchi or rales.  Abdominal:     General: Abdomen is flat. Bowel sounds are normal. There is no distension.     Palpations: Abdomen is soft.     Tenderness: There is no abdominal tenderness.  Musculoskeletal:     Right lower leg: No edema.     Left lower leg: No edema.  Skin:    General: Skin is warm and dry.     Findings: No rash.  Neurological:     Mental Status: He is alert.        Assessment & Plan:   Morbid obesity (HCC) He has morbid obesity associated with HTN.  I gave him a sample of moujaro 2.5mg  subcut weekly and in one month we will start him on 2.5mg  weekly.  I want him to eat healthy and to exercise.    Return in about 3 months (around 09/28/2023).   Crist Fat, MD

## 2023-06-29 NOTE — Assessment & Plan Note (Signed)
 He has morbid obesity associated with HTN.  I gave him a sample of moujaro 2.5mg  subcut weekly and in one month we will start him on 2.5mg  weekly.  I want him to eat healthy and to exercise.

## 2023-06-30 ENCOUNTER — Other Ambulatory Visit: Payer: Self-pay | Admitting: Internal Medicine

## 2023-08-13 ENCOUNTER — Other Ambulatory Visit: Payer: Self-pay | Admitting: Internal Medicine

## 2023-08-16 ENCOUNTER — Other Ambulatory Visit: Payer: Self-pay | Admitting: Internal Medicine

## 2023-08-16 MED ORDER — MOUNJARO 5 MG/0.5ML ~~LOC~~ SOAJ: 5.0000 mg | SUBCUTANEOUS | 2 refills | Status: DC

## 2023-08-17 ENCOUNTER — Other Ambulatory Visit: Payer: Self-pay | Admitting: Internal Medicine

## 2023-08-17 ENCOUNTER — Other Ambulatory Visit: Payer: Self-pay

## 2023-08-17 MED ORDER — ZEPBOUND 5 MG/0.5ML ~~LOC~~ SOAJ: 5.0000 mg | SUBCUTANEOUS | 0 refills | Status: DC

## 2023-08-17 MED ORDER — TIRZEPATIDE-WEIGHT MANAGEMENT 5 MG/0.5ML ~~LOC~~ SOLN: 5.0000 mg | SUBCUTANEOUS | 0 refills | Status: DC

## 2023-08-20 ENCOUNTER — Other Ambulatory Visit: Payer: Self-pay

## 2023-08-20 MED ORDER — TIRZEPATIDE-WEIGHT MANAGEMENT 5 MG/0.5ML ~~LOC~~ SOLN: 5.0000 mg | SUBCUTANEOUS | 0 refills | Status: DC

## 2023-09-05 ENCOUNTER — Other Ambulatory Visit: Payer: Self-pay | Admitting: Internal Medicine

## 2023-09-08 ENCOUNTER — Other Ambulatory Visit: Payer: Self-pay | Admitting: Internal Medicine

## 2023-09-20 ENCOUNTER — Other Ambulatory Visit: Payer: Self-pay | Admitting: Internal Medicine

## 2023-09-22 ENCOUNTER — Other Ambulatory Visit: Payer: Self-pay | Admitting: Internal Medicine

## 2023-09-28 ENCOUNTER — Ambulatory Visit: Admitting: Internal Medicine

## 2023-10-09 ENCOUNTER — Ambulatory Visit: Admitting: Internal Medicine

## 2023-10-09 ENCOUNTER — Encounter: Payer: Self-pay | Admitting: Internal Medicine

## 2023-10-09 VITALS — BP 120/82 | HR 70 | Temp 97.7°F | Resp 18 | Ht 76.0 in | Wt 289.4 lb

## 2023-10-09 DIAGNOSIS — I10 Essential (primary) hypertension: Secondary | ICD-10-CM | POA: Diagnosis not present

## 2023-10-09 DIAGNOSIS — G8929 Other chronic pain: Secondary | ICD-10-CM | POA: Insufficient documentation

## 2023-10-09 DIAGNOSIS — M41119 Juvenile idiopathic scoliosis, site unspecified: Secondary | ICD-10-CM | POA: Insufficient documentation

## 2023-10-09 DIAGNOSIS — Z0000 Encounter for general adult medical examination without abnormal findings: Secondary | ICD-10-CM | POA: Diagnosis not present

## 2023-10-09 DIAGNOSIS — Z6835 Body mass index (BMI) 35.0-35.9, adult: Secondary | ICD-10-CM | POA: Diagnosis not present

## 2023-10-09 DIAGNOSIS — J302 Other seasonal allergic rhinitis: Secondary | ICD-10-CM | POA: Insufficient documentation

## 2023-10-09 DIAGNOSIS — M549 Dorsalgia, unspecified: Secondary | ICD-10-CM

## 2023-10-09 NOTE — Assessment & Plan Note (Signed)
 He has morbid obesity associated with HTN.  Plan as above.

## 2023-10-09 NOTE — Assessment & Plan Note (Signed)
 He is on mounjaro  for weight loss.  I want him to eat healthy, watch portions and exercise.  We will continue to monitor his weigh.

## 2023-10-09 NOTE — Progress Notes (Signed)
 Office Visit  Subjective   Patient ID: Taha Dimond   DOB: 1970-10-06   Age: 53 y.o.   MRN: [id number]   Chief Complaint Chief Complaint  Patient presents with   Annual Exam    CPE     History of Present Illness Riccardo S. Hirt is a 53 year old Caucasian/White male who presents for his annual health maintenance exam. He is due for the following health maintenance studies: screening labs. This patient's past medical history Allergies, Back Pain, Deep Vein Thrombosis, GERD, Hypertension, Scoliosis, Idiopathic, Sinusitis, Chronic, and Thoracic outlet Syndrome.   His last eye exam was 3-4 years ago where he states his vision is doing well. His grandfather did not have prostate cancer but his uncle has prostate cancer. He saw urology in 06/2020 when he had a swollen testicle.  They did check his PSA and DRE which he states was normal. There is no family history of colorectal cancer.  He has not had a colonoscopy.  He is exercising with softball/baseball.  He does get yearly flu vaccines. He has had 3 COVID-19 vaccines including 1 booster. There is no depression or anxiety. There is a family history of CAD in his grandfather. He is not on an ASA.   Neil ia a 53 yo male who returns today for weight loss management.  In the past we had him on wegovy but that was over 2 years ago.  He denied any side effects from wegovy.  On his last visit, we did start him on mounjaro  2.5mg  subcut weekly thru [place] direct.   He has been exercising with his family with baseball/softball.  The patient is starting to watch his diet.  He has cut back on his sweet tea and Dr. Nunzio. He has been on Adipex in the past.   He has been on Topamax in the past but had to stop this where he had side effects of a rash.   He has failed belviq in the past.  I also referred him to the [facility] and he was told he is not a candidate for any surgery.  He is currently on moujaro 2.5mg  subcut weekly and denies any side  effects.  He weighed in 06/2023 297 lbs and today he weighs 289lbs.    I saw [place] in 01/2023 where he was having problems with vertigo.   When he had vertigo, he has no chest pain, palpitations, SOB, syncope or other problems.  He had some fluid behind his left ear a few months prior but his congestion resolved.  There was no hearing loss or tinnitus and no disquilibrium or focal neuro deficit.  [place] showed some nonspecific t wave inversions.  I noted he had a RBBB and 1st degree AV block.  I spoke to DR. Munley and there was no change from an EKG from 2022.  Dr. Monetta ordered an ECHO which was done on 03/05/2023.  This showed a LVEF 60-65% with normal LV function.  There was no regional wall motion abnormalities. There was mild left ventricular hypertrophy.  Left ventricular diastolic parameters were normal.  Right ventricular systolic function was normal. The left atrial size was mildly dilated.  The mitral valve was normal in structure. Mild mitral valve regurgitation. No evidence of mitral stenosis. The aortic valve was normal in structure. Aortic valve regurgitation was mild. No aortic stenosis is present.  Aneurysm of the ascending aorta, measuring 42 mm.  They also did a Zio patch on 03/07/2023 and this showed a predominant underlying rhythm was Sinus Rhythm. First Degree AV Block was present. Bundle Branch Block/IVCD was present. They did a CT chest due to the findings on his ECHO.  CT chest was done on 03/23/2023 and showed a mild fusiform aneurysmal dilatation of the ascending thoracic aorta with maximum measurement of 4.2 cm at the level of the right pulmonary artery. No acute pulmonary findings. Severe right convex thoracic scoliosis.  Dr. Monetta wants to see him back in 1 year.   The patient is a 53 year old Caucasian/White male who presents for a follow-up evaluation of hypertension. The patient has not been checking his blood pressure at home. The patient's  current medications include: lisinopril -hydrochlorothiazide  10-12.5 mg oral tablet. The patient has been tolerating his medications well. The patient denies any headache, visual changes, dizziness, lightheadness, chest pain, shortness of breath, weakness/numbness, and edema. He reports there have been no other symptoms noted.    Vikash also has a history of allergies.  He is affected throught most of the year and takes flonase  and zyrtec.   We did allergy testing on him in the past where he was on immunotherapy. His symptoms included mild wheezing, sinus congestion with ear pressure and he does have rhinorrhea with sneezing.    He also has a history of reflux.  He is no longer on rantidine and is currently on omeprazole which controls his reflux.  Desmund had an EGD done on 06/2014 which showed a small hiatal hernia and normal stomach mucosa.  They did a dilatation of his throat (he was not having symptoms).  Dr. Larene wanted him to come off both omeprazole and ranitidine but when he did that his symptoms returned so he continued on ranitidine.  Today, he states his reflux is controlled and he is not having any problems with swallowing.    Ilai also has a history of chronic back pain but this is resolved since his surgery in 2018.  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  He has seen neurosurgery and scoliosis center in [place] where he had L4-L5 and L5-S1 microlaminectomy and discectomy and since then his pain has been resolved.  He remains on nabumentone at this time.  He has had ESI injections in the past for this.  He also has a history of thoracic outlet obstruction where in 2011 his left arm was discolored.  He was discovered to have a DVT in his left arm and had outlet obstruction where he actually had a rib removed at [facility].  The patient has post op complications of blood loss and was discovered to have a left sided  hemothorax where chest tube placement was performed.  He was on lovenox for about 1.5 months after his discharge.         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,   s/p mechanical thrombectomy ballon angioplasty  and thrombolysis x2  (01-13-2021  pt stated had negative work-up for bleeding order,  and had no previous clot prior and none since 2011)   Hypertension    Right hydrocele    Scoliosis    per pt severe   Thoracic outlet syndrome 11/2009   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, s/p thrombectomy and thrombolysis x2 ;   s/p transaxillary left first rib resection 11/ 2011     Allergies Allergies  Allergen Reactions   Morphine Hives and Nausea And Vomiting     Medications  Current Outpatient Medications:    albuterol  (VENTOLIN  HFA) 108 (90 Base) MCG/ACT inhaler, TAKE 2 PUFFS BY MOUTH EVERY 6 HOURS AS NEEDED FOR WHEEZE OR SHORTNESS OF BREATH, Disp: 18 each, Rfl: 0   Carboxymethylcellulose Sodium (DRY EYE RELIEF OP), Apply 1 drop to eye as needed (eye dryness)., Disp: , Rfl:    cetirizine (ZYRTEC) 10 MG tablet, Take 10 mg by mouth daily., Disp: , Rfl:    fluticasone  (FLONASE ) 50 MCG/ACT nasal spray, PLACE 1 SPRAY INTO BOTH NOSTRILS IN THE MORNING AND AT BEDTIME., Disp: 48 mL, Rfl: 1   hydrochlorothiazide  (HYDRODIURIL ) 12.5 MG tablet, Take 1 tablet by mouth daily., Disp: 90 tablet, Rfl: 1   lisinopril  (ZESTRIL ) 20 MG tablet, Take 1 tablet by mouth daily., Disp: 90 tablet, Rfl: 0   pravastatin  (PRAVACHOL ) 40  MG tablet, TAKE 1 TABLET BY MOUTH EVERYDAY AT BEDTIME, Disp: 90 tablet, Rfl: 1   ZEPBOUND  5 MG/0.5ML injection vial, INJECT 0.5 ML (5 MG) UNDER THE SKIN ONCE WEEKLY (0.5ML= 50 UNITS), Disp: 2 mL, Rfl: 0   Review of Systems Review of Systems  Constitutional:  Negative for chills, fever and malaise/fatigue.  Eyes:  Negative for blurred vision and double vision.  Respiratory:  Negative for cough, hemoptysis, shortness of breath and wheezing.   Cardiovascular:  Negative for chest pain, palpitations and leg swelling.  Gastrointestinal:  Negative for abdominal pain, blood in stool, constipation, diarrhea, heartburn, melena, nausea and vomiting.  Genitourinary:  Negative for frequency and hematuria.  Musculoskeletal:  Negative for back pain and myalgias.  Skin:  Negative for itching and rash.  Neurological:  Negative for dizziness, weakness and headaches.  Endo/Heme/Allergies:  Negative for polydipsia.       Objective:    Vitals BP 120/82   Pulse 70   Temp 97.7 F (36.5 C) (Temporal)   Resp 18   Ht 6' 4 (1.93 m)   Wt 289 lb 6.4 oz (131.3 kg)   SpO2 97%   BMI 35.23 kg/m    Physical Examination Physical Exam Constitutional:      Appearance: Normal appearance. He is not ill-appearing.  HENT:     Head: Normocephalic and atraumatic.     Right Ear: Tympanic membrane, ear canal and external ear normal.     Left Ear: Tympanic membrane, ear canal and external ear normal.     Nose: Nose normal. No congestion or rhinorrhea.     Mouth/Throat:     Mouth: Mucous membranes are moist.     Pharynx: Oropharynx is clear. No oropharyngeal exudate or posterior oropharyngeal erythema.  Eyes:     General: No scleral icterus.    Conjunctiva/sclera: Conjunctivae normal.     Pupils: Pupils are equal, round, and reactive to light.  Neck:     Vascular: No carotid bruit.  Cardiovascular:     Rate and Rhythm: Normal rate and regular rhythm.     Pulses: Normal pulses.     Heart sounds: No murmur  heard.  No friction rub. No gallop.  Pulmonary:     Effort: Pulmonary effort is normal. No respiratory distress.     Breath sounds: No wheezing, rhonchi or rales.  Abdominal:     General: Abdomen is flat. Bowel sounds are normal. There is no distension.     Palpations: Abdomen is soft.     Tenderness: There is no abdominal tenderness.  Musculoskeletal:     Cervical back: Neck supple. No tenderness.     Right lower leg: No edema.     Left lower leg: No edema.  Lymphadenopathy:     Cervical: No cervical adenopathy.  Skin:    General: Skin is warm and dry.     Findings: No rash.  Neurological:     General: No focal deficit present.     Mental Status: He is alert and oriented to person, place, and time.  Psychiatric:        Mood and Affect: Mood normal.        Behavior: Behavior normal.        Assessment & Plan:   Essential hypertension His BP is currently controlled.  We will continue to monitor.  Juvenile idiopathic scoliosis He can take nabumetone  as needed for back pain as well as tylenol  as needed.  Annual physical exam Health maitnenance discussed.  We will refer him for colonscopy.  We will obtain some yearly labs.  BMI 35.0-35.9,adult He is on mounjaro  for weight loss.  I want him to eat healthy, watch portions and exercise.  We will continue to monitor his weigh.  Chronic back pain Plan as above.  Morbid obesity (HCC) He has morbid obesity associated with HTN.  Plan as above.  Seasonal allergies He can use OTC meds as directed.    Return in about 4 months (around 02/09/2024).   Selinda Fleeta Finger, MD

## 2023-10-09 NOTE — Assessment & Plan Note (Signed)
 Plan as above.

## 2023-10-09 NOTE — Assessment & Plan Note (Signed)
 Health maitnenance discussed.  We will refer him for colonscopy.  We will obtain some yearly labs.

## 2023-10-09 NOTE — Assessment & Plan Note (Signed)
His BP is currently controlled.  We will continue to monitor.

## 2023-10-09 NOTE — Assessment & Plan Note (Signed)
 He can use OTC meds as directed.

## 2023-10-09 NOTE — Assessment & Plan Note (Signed)
 He can take nabumetone  as needed for back pain as well as tylenol  as needed.

## 2023-10-10 ENCOUNTER — Ambulatory Visit: Admitting: Internal Medicine

## 2023-10-10 NOTE — Progress Notes (Signed)
 Nurse Visit Lab Draw CBC, CMP, A1C, Lipid, TSH, PSA  Collected from 10/09/2023

## 2023-10-11 LAB — CMP14 + ANION GAP
ALT: 37 IU/L (ref 0–44)
AST: 33 IU/L (ref 0–40)
Albumin: 4.3 g/dL (ref 4.1–5.1)
Alkaline Phosphatase: 49 IU/L (ref 44–121)
Anion Gap: 15 mmol/L (ref 10.0–18.0)
BUN/Creatinine Ratio: 22 — ABNORMAL HIGH (ref 9–20)
BUN: 24 mg/dL (ref 6–24)
Bilirubin Total: 0.7 mg/dL (ref 0.0–1.2)
CO2: 25 mmol/L (ref 20–29)
Calcium: 10.2 mg/dL (ref 8.7–10.2)
Chloride: 101 mmol/L (ref 96–106)
Creatinine, Ser: 1.08 mg/dL (ref 0.76–1.27)
Globulin, Total: 2.5 g/dL (ref 1.5–4.5)
Glucose: 85 mg/dL (ref 70–99)
Potassium: 4.8 mmol/L (ref 3.5–5.2)
Sodium: 141 mmol/L (ref 134–144)
Total Protein: 6.8 g/dL (ref 6.0–8.5)
eGFR: 84 mL/min/1.73 (ref >59)

## 2023-10-11 LAB — CBC WITH DIFFERENTIAL/PLATELET
Basophils Absolute: 0 x10E3/uL (ref 0.0–0.2)
Basos: 1 %
EOS (ABSOLUTE): 0.3 x10E3/uL (ref 0.0–0.4)
Eos: 5 %
Hematocrit: 51.5 % — ABNORMAL HIGH (ref 37.5–51.0)
Hemoglobin: 16.5 g/dL (ref 13.0–17.7)
Immature Grans (Abs): 0 x10E3/uL (ref 0.0–0.1)
Immature Granulocytes: 0 %
Lymphocytes Absolute: 1.6 x10E3/uL (ref 0.7–3.1)
Lymphs: 28 %
MCH: 30.1 pg (ref 26.6–33.0)
MCHC: 32 g/dL (ref 31.5–35.7)
MCV: 94 fL (ref 79–97)
Monocytes Absolute: 0.8 x10E3/uL (ref 0.1–0.9)
Monocytes: 13 %
Neutrophils Absolute: 3 x10E3/uL (ref 1.4–7.0)
Neutrophils: 52 %
Platelets: 306 x10E3/uL (ref 150–450)
RBC: 5.49 x10E6/uL (ref 4.14–5.80)
RDW: 12.8 % (ref 11.6–15.4)
WBC: 5.7 x10E3/uL (ref 3.4–10.8)

## 2023-10-11 LAB — HEMOGLOBIN A1C
Est. average glucose Bld gHb Est-mCnc: 100 mg/dL
Hgb A1c MFr Bld: 5.1 % (ref 4.8–5.6)

## 2023-10-11 LAB — LIPID PANEL
Chol/HDL Ratio: 4.9 ratio (ref 0.0–5.0)
Cholesterol, Total: 156 mg/dL (ref 100–199)
HDL: 32 mg/dL — ABNORMAL LOW (ref >39)
LDL Chol Calc (NIH): 89 mg/dL (ref 0–99)
Triglycerides: 207 mg/dL — ABNORMAL HIGH (ref 0–149)
VLDL Cholesterol Cal: 35 mg/dL (ref 5–40)

## 2023-10-11 LAB — PSA: Prostate Specific Ag, Serum: 0.7 ng/mL (ref 0.0–4.0)

## 2023-10-11 LAB — TSH: TSH: 2.85 u[IU]/mL (ref 0.450–4.500)

## 2023-10-18 ENCOUNTER — Other Ambulatory Visit: Payer: Self-pay | Admitting: Internal Medicine

## 2023-10-22 ENCOUNTER — Ambulatory Visit: Payer: Self-pay

## 2023-10-22 NOTE — Progress Notes (Signed)
 Patient called.  Patient aware.  I have called and informed the patient  His yearly labs look good. SABRA  Pt aware.

## 2023-11-08 ENCOUNTER — Other Ambulatory Visit: Payer: Self-pay | Admitting: Internal Medicine

## 2023-11-22 ENCOUNTER — Other Ambulatory Visit: Payer: Self-pay | Admitting: Internal Medicine

## 2023-11-27 ENCOUNTER — Encounter: Payer: Self-pay | Admitting: Gastroenterology

## 2023-12-03 ENCOUNTER — Other Ambulatory Visit: Payer: Self-pay

## 2023-12-03 MED ORDER — ZEPBOUND 7.5 MG/0.5ML ~~LOC~~ SOAJ: 7.5000 mg | SUBCUTANEOUS | 0 refills | Status: DC

## 2023-12-03 NOTE — Progress Notes (Signed)
 Dose change.

## 2023-12-04 ENCOUNTER — Other Ambulatory Visit: Payer: Self-pay

## 2023-12-04 MED ORDER — TIRZEPATIDE-WEIGHT MANAGEMENT 7.5 MG/0.5ML ~~LOC~~ SOLN: 7.5000 mg | SUBCUTANEOUS | 0 refills | Status: DC

## 2023-12-11 ENCOUNTER — Other Ambulatory Visit: Payer: Self-pay | Admitting: Internal Medicine

## 2024-01-02 ENCOUNTER — Other Ambulatory Visit: Payer: Self-pay | Admitting: Internal Medicine

## 2024-01-18 ENCOUNTER — Ambulatory Visit

## 2024-01-18 ENCOUNTER — Telehealth: Payer: Self-pay

## 2024-01-18 VITALS — Ht 76.0 in | Wt 272.0 lb

## 2024-01-18 DIAGNOSIS — Z1211 Encounter for screening for malignant neoplasm of colon: Secondary | ICD-10-CM

## 2024-01-18 MED ORDER — NA SULFATE-K SULFATE-MG SULF 17.5-3.13-1.6 GM/177ML PO SOLN: 1.0000 | Freq: Once | ORAL | 0 refills | Status: AC

## 2024-01-18 NOTE — Progress Notes (Signed)
 No egg or soy allergy known to patient  No issues known to pt with past sedation with any surgeries or procedures Patient denies ever being told they had issues or difficulty with intubation  No FH of Malignant Hyperthermia Pt is on diet pills, Mounjara and hold instructions were provided Pt is not on  home 02  Pt is not on blood thinners   Pt has some issues with constipation , takes stool softners and fiber gummies   No A fib or A flutter Have any cardiac testing pending--No Pt can ambulate  Pt denies use of chewing tobacco Discussed diabetic I weight loss medication holds Discussed NSAID holds Checked BMI Pt instructed to use [employer] or GoodRx for a price reduction on prep  Patient's chart reviewed by Norleen Schillings CNRA prior to previsit and patient appropriate for the [facility].  Pre visit completed and red dot placed by patient's name on their procedure day (on provider's schedule).

## 2024-01-18 NOTE — Telephone Encounter (Signed)
 John, Please review last echo and cardiology notes.  Patient does have a hx of Venous Thoracic Outlet Syndrome.  Please advise if he is cleared for a colonoscopy at the [facility] which is scheduled with Dr. Charlanne on 01/25/24 Thank you, Nora

## 2024-01-21 ENCOUNTER — Encounter: Payer: Self-pay | Admitting: Radiology

## 2024-01-22 ENCOUNTER — Encounter: Payer: Self-pay | Admitting: Gastroenterology

## 2024-01-23 ENCOUNTER — Other Ambulatory Visit: Payer: Self-pay | Admitting: Internal Medicine

## 2024-01-25 ENCOUNTER — Ambulatory Visit: Admitting: Gastroenterology

## 2024-01-25 ENCOUNTER — Encounter: Payer: Self-pay | Admitting: Gastroenterology

## 2024-01-25 ENCOUNTER — Other Ambulatory Visit: Payer: Self-pay | Admitting: Internal Medicine

## 2024-01-25 VITALS — BP 115/91 | HR 79 | Temp 98.0°F | Resp 13 | Ht 76.0 in | Wt 272.0 lb

## 2024-01-25 DIAGNOSIS — K5730 Diverticulosis of large intestine without perforation or abscess without bleeding: Secondary | ICD-10-CM | POA: Diagnosis not present

## 2024-01-25 DIAGNOSIS — Z1211 Encounter for screening for malignant neoplasm of colon: Secondary | ICD-10-CM | POA: Diagnosis present

## 2024-01-25 DIAGNOSIS — K640 First degree hemorrhoids: Secondary | ICD-10-CM

## 2024-01-25 DIAGNOSIS — D123 Benign neoplasm of transverse colon: Secondary | ICD-10-CM

## 2024-01-25 DIAGNOSIS — D122 Benign neoplasm of ascending colon: Secondary | ICD-10-CM | POA: Diagnosis not present

## 2024-01-25 MED ORDER — SODIUM CHLORIDE 0.9 % IV SOLN: 500.0000 mL | Freq: Once | INTRAVENOUS | Status: DC

## 2024-01-25 NOTE — Progress Notes (Signed)
 Transferred to PACU via stretcher.  Not responding to stimulation at this time.  VSS upon leaving procedure room.

## 2024-01-25 NOTE — Op Note (Signed)
 [facility] Patient Name: Todd Powers Procedure Date: 01/25/2024 10:34 AM MRN: [id number] Endoscopist: Lynnie Bring , MD, [id number] Age: 53 Referring MD:  Date of Birth: 1970-09-18 Gender: Male Account #: [id number] Procedure:                Colonoscopy Indications:              Screening for colorectal malignant neoplasm Medicines:                Monitored Anesthesia Care Procedure:                Pre-Anesthesia Assessment:                           - Prior to the procedure, a History and Physical                            was performed, and patient medications and                            allergies were reviewed. The patient's tolerance of                            previous anesthesia was also reviewed. The risks                            and benefits of the procedure and the sedation                            options and risks were discussed with the patient.                            All questions were answered, and informed consent                            was obtained. Prior Anticoagulants: The patient has                            taken no anticoagulant or antiplatelet agents. ASA                            Grade Assessment: II - A patient with mild systemic                            disease. After reviewing the risks and benefits,                            the patient was deemed in satisfactory condition to                            undergo the procedure.                           After obtaining informed consent, the colonoscope  was passed under direct vision. Throughout the                            procedure, the patient's blood pressure, pulse, and                            oxygen saturations were monitored continuously. The                            Colonoscope (CF)was introduced through the anus and                            advanced to the the cecum, identified by                            appendiceal orifice and  ileocecal valve. The                            colonoscopy was performed without difficulty. The                            patient tolerated the procedure well. The quality                            of the bowel preparation was adequate to identify                            polyps. The ileocecal valve, appendiceal orifice,                            and rectum were photographed. Scope In: 10:50:39 AM Scope Out: 11:16:38 AM Scope Withdrawal Time: 0 hours 20 minutes 4 seconds  Total Procedure Duration: 0 hours 25 minutes 59 seconds  Findings:                 Six sessile polyps were found in the proximal                            transverse colon (1), proximal ascending colon(2)                            and mid ascending colon(3). The polyps were 2 to 3                            mm in size. These polyps were removed with a cold                            snare. Resection and retrieval were complete.                           A few rare small-mouthed diverticula were found in                            the sigmoid colon.  Non-bleeding internal hemorrhoids were found during                            retroflexion. The hemorrhoids were small and Grade                            I (internal hemorrhoids that do not prolapse).                           The exam was otherwise without abnormality on                            direct and retroflexion views. Complications:            No immediate complications. Estimated Blood Loss:     Estimated blood loss: none. Impression:               - Six 2 to 3 mm polyps in the proximal transverse                            colon, in the proximal ascending colon and in the                            mid ascending colon, removed with a cold snare.                            Resected and retrieved.                           - Minimal sigmoid diverticulosis.                           - Non-bleeding internal hemorrhoids.                            - The examination was otherwise normal on direct                            and retroflexion views. Recommendation:           - Patient has a contact number available for                            emergencies. The signs and symptoms of potential                            delayed complications were discussed with the                            patient. Return to normal activities tomorrow.                            Written discharge instructions were provided to the                            patient.                           -  Resume previous diet.                           - Continue present medications.                           - Await pathology results.                           - Repeat colonoscopy for surveillance based on                            pathology results.                           - The findings and recommendations were discussed                            with the patient's family. Lynnie Bring, MD 01/25/2024 11:23:14 AM This report has been signed electronically.

## 2024-01-25 NOTE — Progress Notes (Signed)
Pt. states no medical or surgical changes since previsit or office visit. 

## 2024-01-25 NOTE — Progress Notes (Signed)
 [facility] History and Physical   Primary Care Physician:  Fleeta Valeria Mayo, MD   Reason for Procedure:   CRC screening  Plan:    colon   The patient was provided an opportunity to ask questions and all were answered. The patient agreed with the plan.   HPI: Todd Powers is a 53 y.o. 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,   s/p mechanical thrombectomy ballon angioplasty  and thrombolysis x2  (01-13-2021  pt stated had negative work-up for bleeding order,  and had no previous clot prior and none since 2011)   Hypertension    Right hydrocele    Scoliosis    per pt severe   Seizures (HCC)    Thoracic outlet syndrome 11/2009   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, s/p thrombectomy and thrombolysis x2 ;   s/p transaxillary left first rib resection 11/ 2011    Past Surgical History:  Procedure Laterality Date   ESOPHAGOGASTRODUODENOSCOPY (EGD) WITH ESOPHAGEAL DILATION  2012   HYDROCELE EXCISION Right 01/18/2021   Procedure: HYDROCELECTOMY ADULT;  Surgeon: Elisabeth Valli BIRCH, MD;  Location: [facility];  Service: Urology;  Laterality: Right;   LUMBAR LAMINECTOMY/DECOMPRESSION MICRODISCECTOMY  03/06/2017   @[facility] ;  L4--S1   NASAL SEPTUM SURGERY  2018   RIB RESECTION Left 01/31/2010   @[facility] ;  TRANSAXILLERY FIRST RIB RESECTION   VIDEO ASSISTED THORACOSCOPY (VATS)/DECORTICATION Left 02/06/2010   @[facility] ;  and evacuation hematoma    Prior to Admission medications   Medication  Sig Start Date End Date Taking? Authorizing Provider  Dihydroxyaluminum Sod Carb (ROLAIDS PO) Take 1 tablet by mouth daily as needed.   Yes [provider]  fluticasone  (FLONASE ) 50 MCG/ACT nasal spray PLACE 1 SPRAY INTO BOTH NOSTRILS IN THE MORNING AND AT BEDTIME. 09/10/23  Yes Fleeta Valeria Mayo, MD  hydrochlorothiazide  (HYDRODIURIL ) 12.5 MG tablet Take 1 tablet by mouth daily. 05/08/22  Yes Fleeta Valeria Mayo, MD  lisinopril  (ZESTRIL ) 20 MG tablet Take 1 tablet by mouth daily. 01/25/24  Yes Fleeta Valeria Mayo, MD  omeprazole (PRILOSEC) 20 MG capsule Take 20 mg by mouth daily.   Yes [provider]  pravastatin  (PRAVACHOL ) 40 MG tablet TAKE 1 TABLET BY MOUTH EVERYDAY AT BEDTIME 08/14/23  Yes Fleeta Valeria, Mayo, MD  albuterol  (VENTOLIN  HFA) 108 (90 Base) MCG/ACT inhaler TAKE 2 PUFFS BY MOUTH EVERY 6 HOURS AS NEEDED FOR WHEEZE OR SHORTNESS OF BREATH 12/18/22   Fleeta Valeria Mayo, MD  Carboxymethylcellulose Sodium (DRY EYE RELIEF OP) Apply 1 drop to eye as needed (eye dryness).    [provider]  cetirizine (ZYRTEC) 10 MG tablet Take 10 mg by mouth daily.    [provider]  hydrochlorothiazide  (MICROZIDE ) 12.5 MG capsule Take 1 capsule by mouth daily. 01/25/24   Fleeta Valeria Mayo, MD  nabumetone  (RELAFEN ) 750 MG tablet Take 1 tablet by mouth twice daily. 01/25/24   Fleeta Valeria Mayo, MD  ZEPBOUND  7.5 MG/0.5ML injection vial INJECT 0.5 ML (7.5 MG) UNDER THE  SKIN ONCE WEEKLY (0.5ML= 50 UNITS) 01/25/24   Fleeta Valeria Mayo, MD    Current Outpatient Medications  Medication Sig Dispense Refill   Dihydroxyaluminum Sod Carb (ROLAIDS PO) Take 1 tablet by mouth daily as needed.     fluticasone  (FLONASE ) 50 MCG/ACT nasal spray PLACE 1 SPRAY INTO BOTH NOSTRILS IN THE MORNING AND AT BEDTIME. 48 mL 1   hydrochlorothiazide  (HYDRODIURIL ) 12.5 MG tablet Take 1 tablet by mouth daily. 90 tablet 1   lisinopril  (ZESTRIL ) 20 MG tablet Take 1 tablet by mouth daily. 90 tablet 0   omeprazole (PRILOSEC) 20 MG capsule  Take 20 mg by mouth daily.     pravastatin  (PRAVACHOL ) 40 MG tablet TAKE 1 TABLET BY MOUTH EVERYDAY AT BEDTIME 90 tablet 1   albuterol  (VENTOLIN  HFA) 108 (90 Base) MCG/ACT inhaler TAKE 2 PUFFS BY MOUTH EVERY 6 HOURS AS NEEDED FOR WHEEZE OR SHORTNESS OF BREATH 18 each 0   Carboxymethylcellulose Sodium (DRY EYE RELIEF OP) Apply 1 drop to eye as needed (eye dryness).     cetirizine (ZYRTEC) 10 MG tablet Take 10 mg by mouth daily.     hydrochlorothiazide  (MICROZIDE ) 12.5 MG capsule Take 1 capsule by mouth daily. 90 capsule 0   nabumetone  (RELAFEN ) 750 MG tablet Take 1 tablet by mouth twice daily. 180 tablet 0   ZEPBOUND  7.5 MG/0.5ML injection vial INJECT 0.5 ML (7.5 MG) UNDER THE SKIN ONCE WEEKLY (0.5ML= 50 UNITS) 2 mL 0   Current Facility-Administered Medications  Medication Dose Route Frequency Provider Last Rate Last Admin   0.9 %  sodium chloride infusion  500 mL Intravenous Once Charlanne Groom, MD        Allergies as of 01/25/2024 - Review Complete 01/25/2024  Allergen Reaction Noted   Morphine Nausea And Vomiting 02/28/2017    Family History  Problem Relation Age of Onset   Allergic rhinitis Father    Allergic rhinitis Daughter    Colon cancer Neg Hx    Rectal cancer Neg Hx    Stomach cancer Neg Hx    Esophageal cancer Neg Hx     Social History   Socioeconomic History   Marital status: Married    Spouse name: Not on file   Number of children: Not on file   Years of education: Not on file   Highest education level: Not on file  Occupational History   Not on file  Tobacco Use   Smoking status: Never   Smokeless tobacco: Never  Vaping Use   Vaping status: Never Used  Substance and Sexual Activity   Alcohol use: Not Currently   Drug use: Never   Sexual activity: Not on file    Comment: vasectomy at urologist office  Other Topics Concern   Not on file  Social History Narrative   Not on file   Social Drivers of Health   Financial Resource Strain: Not on file  Food  Insecurity: Not on file  Transportation Needs: Not on file  Physical Activity: Not on file  Stress: Not on file  Social Connections: Not on file  Intimate Partner Violence: Not on file    Review of Systems: Positive for none All other review of systems negative except as mentioned in the HPI.  Physical Exam: Vital signs in last 24 hours: @VSRANGES @   General:   Alert,  Well-developed, well-nourished, pleasant and cooperative in NAD Lungs:  Clear throughout to auscultation.   Heart:  Regular rate and rhythm; no murmurs, clicks, rubs,  or gallops. Abdomen:  Soft, nontender and nondistended. Normal bowel sounds.   Neuro/Psych:  Alert and cooperative. Normal mood and affect. A and O x 3    No significant changes were identified.  The patient continues to be an appropriate candidate for the planned procedure and anesthesia.   Anselm Bring, MD. [facility] 01/25/2024 10:38 AM@

## 2024-01-25 NOTE — Progress Notes (Signed)
 Called to room to assist during endoscopic procedure.  Patient ID and intended procedure confirmed with present staff. Received instructions for my participation in the procedure from the performing physician.

## 2024-01-25 NOTE — Patient Instructions (Signed)

## 2024-01-28 ENCOUNTER — Telehealth: Payer: Self-pay

## 2024-01-28 NOTE — Telephone Encounter (Signed)
 Left message on follow up call.

## 2024-01-29 LAB — SURGICAL PATHOLOGY

## 2024-01-30 ENCOUNTER — Ambulatory Visit: Payer: Self-pay | Admitting: Gastroenterology

## 2024-01-31 ENCOUNTER — Ambulatory Visit: Admitting: Internal Medicine

## 2024-01-31 ENCOUNTER — Encounter: Payer: Self-pay | Admitting: Internal Medicine

## 2024-01-31 DIAGNOSIS — J0100 Acute maxillary sinusitis, unspecified: Secondary | ICD-10-CM

## 2024-01-31 MED ORDER — AMOXICILLIN-POT CLAVULANATE 875-125 MG PO TABS: 1.0000 | ORAL_TABLET | Freq: Two times a day (BID) | ORAL | 0 refills | Status: AC

## 2024-01-31 NOTE — Assessment & Plan Note (Signed)
 We did covid testing and he was negative.  He can use his neti-pot and we will start him on augmentin  at this time.  Continue supportive care.

## 2024-01-31 NOTE — Progress Notes (Signed)
 Office Visit  Subjective   Patient ID: Todd Powers   DOB: Jun 07, 1970   Age: 53 y.o.   MRN: [id number]   Chief Complaint No chief complaint on file.    History of Present Illness The patient comes in today for an acute visit due to upper respiratory illness.  He states this started about 2 weeks ago where he has sinus congestion.  This has turned into yellow-green nasal discharge with ear pressure.  He denies any fevers, chills, SOB, wheezing, sore throat, nausea, vomiting or diarrhea.     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,   s/p mechanical thrombectomy ballon angioplasty  and thrombolysis x2  (01-13-2021  pt stated had negative work-up for bleeding order,  and had no previous clot prior and none since 2011)   Hypertension    Right hydrocele    Scoliosis    per pt severe   Seizures (HCC)    Thoracic outlet syndrome 11/2009   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, s/p thrombectomy and thrombolysis x2 ;   s/p transaxillary left first rib resection 11/ 2011     Allergies Allergies  Allergen Reactions   Morphine Nausea And Vomiting    Maybe hives     Medications  Current Outpatient Medications:    albuterol  (VENTOLIN  HFA) 108 (90 Base) MCG/ACT inhaler, TAKE 2 PUFFS BY MOUTH EVERY 6 HOURS AS NEEDED FOR WHEEZE OR SHORTNESS OF BREATH, Disp: 18 each, Rfl: 0   Carboxymethylcellulose Sodium (DRY EYE RELIEF OP), Apply 1 drop to eye as needed (eye dryness)., Disp: , Rfl:    cetirizine (ZYRTEC) 10 MG tablet,  Take 10 mg by mouth daily., Disp: , Rfl:    Dihydroxyaluminum Sod Carb (ROLAIDS PO), Take 1 tablet by mouth daily as needed., Disp: , Rfl:    fluticasone  (FLONASE ) 50 MCG/ACT nasal spray, PLACE 1 SPRAY INTO BOTH NOSTRILS IN THE MORNING AND AT BEDTIME., Disp: 48 mL, Rfl: 1   hydrochlorothiazide  (HYDRODIURIL ) 12.5 MG tablet, Take 1 tablet by mouth daily., Disp: 90 tablet, Rfl: 1   hydrochlorothiazide  (MICROZIDE ) 12.5 MG capsule, Take 1 capsule by mouth daily., Disp: 90 capsule, Rfl: 0   lisinopril  (ZESTRIL ) 20 MG tablet, Take 1 tablet by mouth daily., Disp: 90 tablet, Rfl: 0   nabumetone  (RELAFEN ) 750 MG tablet, Take 1 tablet by mouth twice daily., Disp: 180 tablet, Rfl: 0   omeprazole (PRILOSEC) 20 MG capsule, Take 20 mg by mouth daily., Disp: , Rfl:    pravastatin  (PRAVACHOL ) 40 MG tablet, TAKE 1 TABLET BY MOUTH EVERYDAY AT BEDTIME, Disp: 90 tablet, Rfl: 1   ZEPBOUND  7.5 MG/0.5ML injection vial, INJECT 0.5 ML (7.5 MG) UNDER THE SKIN ONCE WEEKLY (0.5ML= 50 UNITS), Disp: 2 mL, Rfl: 0   Review of Systems Review of Systems  Constitutional:  Negative for chills and fever.  HENT:  Positive for congestion, ear pain and sinus pain. Negative for sore throat.   Respiratory:  Negative for cough.   Cardiovascular:  Negative for chest pain.  Gastrointestinal:  Negative  for abdominal pain, constipation, diarrhea, nausea and vomiting.  Musculoskeletal:  Negative for myalgias.  Skin:  Negative for rash.  Neurological:  Negative for dizziness, weakness and headaches.       Objective:    Vitals There were no vitals taken for this visit.   Physical Examination Physical Exam Constitutional:      Appearance: Normal appearance. He is not ill-appearing.  Cardiovascular:     Rate and Rhythm: Normal rate and regular rhythm.     Pulses: Normal pulses.     Heart sounds: No murmur heard.    No friction rub. No gallop.  Pulmonary:     Effort: Pulmonary effort is normal. No respiratory distress.     Breath  sounds: No wheezing, rhonchi or rales.  Abdominal:     General: Abdomen is flat. Bowel sounds are normal. There is no distension.     Palpations: Abdomen is soft.     Tenderness: There is no abdominal tenderness.  Musculoskeletal:     Right lower leg: No edema.     Left lower leg: No edema.  Skin:    General: Skin is warm and dry.     Findings: No rash.  Neurological:     Mental Status: He is alert.        Assessment & Plan:   Acute non-recurrent maxillary sinusitis We did covid testing and he was negative.  He can use his neti-pot and we will start him on augmentin  at this time.  Continue supportive care.    No follow-ups on file.   Selinda Fleeta Finger, MD

## 2024-02-01 ENCOUNTER — Encounter: Admitting: Gastroenterology

## 2024-02-08 ENCOUNTER — Encounter: Payer: Self-pay | Admitting: Internal Medicine

## 2024-02-08 ENCOUNTER — Ambulatory Visit: Admitting: Internal Medicine

## 2024-02-08 VITALS — BP 116/78 | HR 69 | Temp 97.8°F | Resp 16 | Ht 76.0 in | Wt 277.0 lb

## 2024-02-08 DIAGNOSIS — Z6833 Body mass index (BMI) 33.0-33.9, adult: Secondary | ICD-10-CM | POA: Insufficient documentation

## 2024-02-08 DIAGNOSIS — E6609 Other obesity due to excess calories: Secondary | ICD-10-CM | POA: Diagnosis not present

## 2024-02-08 DIAGNOSIS — I10 Essential (primary) hypertension: Secondary | ICD-10-CM | POA: Diagnosis not present

## 2024-02-08 DIAGNOSIS — Z23 Encounter for immunization: Secondary | ICD-10-CM | POA: Diagnosis not present

## 2024-02-08 DIAGNOSIS — E66811 Obesity, class 1: Secondary | ICD-10-CM | POA: Diagnosis not present

## 2024-02-08 MED ORDER — MOUNJARO 10 MG/0.5ML ~~LOC~~ SOAJ: 10.0000 mg | SUBCUTANEOUS | 0 refills | Status: DC

## 2024-02-08 NOTE — Progress Notes (Signed)
 Office Visit  Subjective   Patient ID: Todd Powers   DOB: [birth year]   Age: 53 y.o.   MRN: [id number]   Chief Complaint Chief Complaint  Patient presents with   Follow-up    4 Month follow up     History of Present Illness Todd Powers ia a 53 yo male who returns today for weight loss management.  We did change him from wegovy to mounjaro  in 06/2023.  He weighed 297 lbs in 06/2023 and in followed in 09/2023 he weighed 289 lbs and today he is now 277 lbs.  The patient denies any side effects from the mounjaro .  He is currently on mounjaro  7.5mg  subcut weekly.  He has been exercising with his family with baseball/softball.  The patient is starting to watch his diet.  He has cut back on his sweet tea and Dr. Nunzio. He has been on Adipex in the past.   He has been on Topamax in the past but had to stop this where he had side effects of a rash.   He has failed belviq in the past.  I also referred him to the [facility] and he was told he is not a candidate for any surgery.      The patient is a 53 year old Caucasian/White male who presents for a follow-up evaluation of hypertension. The patient has not been checking his blood pressure at home. The patient's current medications include: lisinopril -hydrochlorothiazide  10-12.5 mg oral tablet. The patient has been tolerating his medications well. The patient denies any headache, visual changes, dizziness, lightheadness, chest pain, shortness of breath, weakness/numbness, and edema. He reports there have been no other symptoms noted.      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   hospital admission in epic 11-22-2009 dx acute occlusive left  subclavian axillary brachial venous thrombosis,   s/p mechanical thrombectomy ballon angioplasty  and thrombolysis x2  (01-13-2021  pt stated had negative work-up for bleeding order,  and had no previous clot prior and none since 2011)   Hypertension    Right hydrocele    Scoliosis    per pt severe   Seizures (HCC)    Thoracic outlet syndrome 11/2009   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, s/p thrombectomy and thrombolysis x2 ;   s/p transaxillary left first rib resection 11/ 2011     Allergies Allergies  Allergen Reactions   Morphine Nausea And Vomiting    Maybe hives     Medications  Current Outpatient Medications:    amoxicillin -clavulanate (AUGMENTIN ) 875-125 MG tablet, Take 1 tablet by mouth 2 (two) times daily for 10 days., Disp: 20 tablet, Rfl: 0   Carboxymethylcellulose Sodium (DRY EYE RELIEF OP), Apply 1 drop to eye as needed (eye dryness)., Disp: , Rfl:    cetirizine (ZYRTEC) 10 MG tablet, Take 10 mg by mouth daily., Disp: , Rfl:    Dihydroxyaluminum Sod Carb (ROLAIDS PO), Take 1 tablet by mouth daily as needed., Disp: , Rfl:    fluticasone  (FLONASE ) 50 MCG/ACT nasal spray, PLACE 1 SPRAY INTO BOTH NOSTRILS IN THE MORNING AND AT BEDTIME., Disp:  48 mL, Rfl: 1   hydrochlorothiazide  (MICROZIDE ) 12.5 MG capsule, Take 1 capsule by mouth daily., Disp: 90 capsule, Rfl: 0   lisinopril  (ZESTRIL ) 20 MG tablet, Take 1 tablet by mouth daily., Disp: 90 tablet, Rfl: 0   nabumetone  (RELAFEN ) 750 MG tablet, Take 1 tablet by mouth twice daily., Disp: 180 tablet, Rfl: 0   omeprazole (PRILOSEC) 20 MG capsule, Take 20 mg by mouth daily., Disp: , Rfl:    pravastatin  (PRAVACHOL ) 40 MG tablet, TAKE 1 TABLET BY MOUTH EVERYDAY AT BEDTIME, Disp: 90 tablet, Rfl: 1   Review of Systems Review of Systems  Constitutional:  Negative for chills and fever.  Eyes:  Negative for blurred vision and double vision.  Respiratory:  Negative  for cough and shortness of breath.   Cardiovascular:  Negative for chest pain, palpitations and leg swelling.  Gastrointestinal:  Negative for abdominal pain, constipation, diarrhea, nausea and vomiting.  Musculoskeletal:  Negative for myalgias.  Skin:  Negative for itching and rash.  Neurological:  Negative for dizziness, weakness and headaches.       Objective:    Vitals BP 116/78   Pulse 69   Temp 97.8 F (36.6 C) (Temporal)   Resp 16   Ht 6' 4 (1.93 m)   Wt 277 lb (125.6 kg)   SpO2 96%   BMI 33.72 kg/m    Physical Examination Physical Exam Constitutional:      Appearance: Normal appearance. He is not ill-appearing.  Cardiovascular:     Rate and Rhythm: Normal rate and regular rhythm.     Pulses: Normal pulses.     Heart sounds: No murmur heard.    No friction rub. No gallop.  Pulmonary:     Effort: Pulmonary effort is normal. No respiratory distress.     Breath sounds: No wheezing, rhonchi or rales.  Abdominal:     General: Abdomen is flat. Bowel sounds are normal. There is no distension.     Palpations: Abdomen is soft.     Tenderness: There is no abdominal tenderness.  Musculoskeletal:     Right lower leg: No edema.     Left lower leg: No edema.  Skin:    General: Skin is warm and dry.     Findings: No rash.  Neurological:     Mental Status: He is alert.        Assessment & Plan:   Essential hypertension His BP is currently doing well.   We will continue his current meds.  Class 1 obesity due to excess calories with serious comorbidity and body mass index (BMI) of 33.0 to 33.9 in adult I want him to increase his mounjaro  to 10mg  subcut weekly.  I want him to diet and exercise and lose weight.    Return in about 3 months (around 05/10/2024).   Selinda Fleeta Finger, MD

## 2024-02-08 NOTE — Assessment & Plan Note (Signed)
 His BP is currently doing well.   We will continue his current meds.

## 2024-02-08 NOTE — Addendum Note (Signed)
 Addended by: LENETTA LACKS on: 02/08/2024 02:04 PM   Modules accepted: Orders

## 2024-02-08 NOTE — Assessment & Plan Note (Signed)
 I want him to increase his mounjaro  to 10mg  subcut weekly.  I want him to diet and exercise and lose weight.

## 2024-02-10 ENCOUNTER — Other Ambulatory Visit: Payer: Self-pay | Admitting: Internal Medicine

## 2024-02-18 ENCOUNTER — Other Ambulatory Visit: Payer: Self-pay | Admitting: Internal Medicine

## 2024-03-09 ENCOUNTER — Other Ambulatory Visit: Payer: Self-pay | Admitting: Internal Medicine

## 2024-03-17 ENCOUNTER — Other Ambulatory Visit: Payer: Self-pay

## 2024-03-17 MED ORDER — ZEPBOUND 7.5 MG/0.5ML ~~LOC~~ SOAJ: 7.5000 mg | SUBCUTANEOUS | 0 refills | Status: DC

## 2024-03-24 ENCOUNTER — Other Ambulatory Visit: Payer: Self-pay

## 2024-03-24 MED ORDER — ZEPBOUND 7.5 MG/0.5ML ~~LOC~~ SOLN: 7.5000 mg | SUBCUTANEOUS | 0 refills | Status: AC

## 2024-04-09 ENCOUNTER — Ambulatory Visit

## 2024-04-09 VITALS — BP 126/80 | HR 88 | Temp 97.2°F | Resp 18 | Ht 76.0 in | Wt 283.4 lb

## 2024-04-09 DIAGNOSIS — R0981 Nasal congestion: Secondary | ICD-10-CM

## 2024-04-09 DIAGNOSIS — J069 Acute upper respiratory infection, unspecified: Secondary | ICD-10-CM

## 2024-04-09 LAB — POC INFLUENZA A&B (BINAX/QUICKVUE): Influenza A, POC: NEGATIVE

## 2024-04-09 LAB — POC COVID19 BINAXNOW: SARS Coronavirus 2 Ag: NEGATIVE

## 2024-04-09 MED ORDER — AMOXICILLIN-POT CLAVULANATE 875-125 MG PO TABS: 1.0000 | ORAL_TABLET | Freq: Two times a day (BID) | ORAL | 0 refills | Status: AC

## 2024-04-09 NOTE — Progress Notes (Signed)
 Patient: Todd Powers DOB: [birth year] Age: 54 Sex: Male Date of Visit: 04/09/2024  Subjective Mr. Zaelyn Barbary presents with cough and congestion lasting >14 days. He reports a dry cough with occasional yellowish sputum. He denies fever, chills, shortness of breath, wheezing, or sinus pressure. He reports a sore throat. Denies pain. Review of Systems (ROS): Constitutional: Denies fever, chills, fatigue, or weight changes HEENT: Positive nasal congestion, postnasal drip, sinus tenderness, sore throat; denies ear pain, hearing changes, or nasal bleeding Respiratory: Positive cough with occasional yellow sputum; denies shortness of breath, wheezing, chest tightness Cardiovascular: Denies chest pain, palpitations, dizziness, edema Gastrointestinal: Denies nausea, vomiting, diarrhea, abdominal pain  Objective Vital Signs: BP: 126/80 mmHg Pulse: 88 bpm Respirations: 18 O2 Sat: 96% on room air Weight: 283 lb 6 oz Height: 6'4  Physical Exam: General: Alert, oriented, well-appearing male in no acute distress Nose/Sinuses: Nasal mucosa congested, sinus tenderness present Mouth/Throat: Mucous membranes moist, oropharynx without exudate or erythema, postnasal drip noted Neck: Supple, no cervical lymphadenopathy Respiratory: Normal respiratory effort; lungs clear bilaterally; no wheezes, rhonchi, or rales Cardiovascular: Regular rate and rhythm, normal S1/S2; no murmurs, rubs, or gallops Neurological: Alert and oriented 3; no focal deficits  Assessment Acute bacterial sinusitis  Plan Medications: Augmentin  (amoxicillin -clavulanate) -- prescription called in to [facility] Flonase  nasal spray: 2 sprays in each nostril daily for nasal congestion Supportive Care: Rest Increase fluid intake Tylenol  as needed for discomfort or fever Patient Education: Reviewed likely diagnosis and rationale for antibiotics Discussed proper use of Flonase  Instructed patient to complete full antibiotic  course Reviewed warning signs: worsening symptoms, fever, shortness of breath, chest pain, or no improvement after antibiotics Patient verbalized understanding and agreed with plan Follow-Up: As needed or if symptoms fail to improve

## 2024-04-18 ENCOUNTER — Ambulatory Visit

## 2024-04-18 VITALS — BP 132/90 | HR 66 | Temp 98.0°F | Resp 16 | Ht 76.0 in | Wt 279.2 lb

## 2024-04-18 DIAGNOSIS — R051 Acute cough: Secondary | ICD-10-CM

## 2024-04-18 DIAGNOSIS — B9689 Other specified bacterial agents as the cause of diseases classified elsewhere: Secondary | ICD-10-CM

## 2024-04-18 MED ORDER — DOXYCYCLINE HYCLATE 100 MG PO TABS: 100.0000 mg | ORAL_TABLET | Freq: Two times a day (BID) | ORAL | 0 refills | Status: AC

## 2024-04-18 MED ORDER — GUAIFENESIN-DM 100-10 MG/5ML PO SYRP: 5.0000 mL | ORAL_SOLUTION | ORAL | 0 refills | Status: DC | PRN

## 2024-04-22 NOTE — Assessment & Plan Note (Signed)
 Dextromethorphan-guaifenesin  (DM) ordered

## 2024-04-22 NOTE — Progress Notes (Signed)
 Patient Name: Todd Powers DOB: [birth year] Age: 54 year old male Date of Visit: 04/18/2024 Reason for Visit: Acute visit - worsening cough, and nasal congestion  Subjective Chief Complaint: Worsening cough and nasal congestion  History of Present Illness: Todd Powers is a 54 year old male presenting for evaluation of persistent upper respiratory symptoms. Patient reports continued nasal congestion with worsening cough. He describes a persistent dry cough with occasional yellowish sputum production and associated sore throat. Patient was previously evaluated on 04/09/2024 for cough and congestion lasting greater than 14 days. At that time, Augmentin  and Flonase  nasal spray were prescribed. Patient reports partial relief but states symptoms have persisted and cough has worsened.  Patient reports use of Alka-Seltzer sinus medication and Mucinex  nasal spray with mild relief.  Patient denies fever, chills, shortness of breath, or wheezing.  Patient requested doxycycline , stating this medication has been effective for him in treating similar symptoms in the past. Risks, benefits, and alternative antibiotic options were discussed.   Shared decision-making was utilized, and patient verbalized understanding.  Allergies Morphine  Review of Systems Constitutional: Denies fever, chills, fatigue, or weight changes. HEENT: Positive for nasal congestion, sore throat, and left ear pain. Respiratory: Positive for cough with occasional sputum production. Denies shortness of breath or wheezing. Cardiovascular: Denies chest pain, palpitations, or edema. Gastrointestinal: Denies nausea, vomiting, diarrhea, or abdominal pain. Genitourinary: Denies urinary complaints. Musculoskeletal: Denies myalgias or joint pain. Neurological: Denies headaches, dizziness, or weakness.  Objective Vital Signs: Blood Pressure: 132/90 mmHg Pulse: 66 bpm Temperature: 60F (Temporal) Respirations: 16/min SpO?: 97%  on room air Weight: 279 lbs 3.2 oz Height: 6'4  Physical Examination Constitutional: Alert and oriented. No acute distress. Head: Normocephalic, atraumatic. Eyes: Pupils equal, round, reactive to light. Extraocular movements intact. Ears: Right and left tympanic membrane intact without erythema. Nose: Nasal congestion and mucosal edema present. Throat: Mild posterior oropharyngeal erythema without exudate. Neck: Supple. No cervical lymphadenopathy. Cardiovascular: Regular rate and rhythm. No murmurs, rubs, or gallops. Respiratory: Lungs clear to auscultation bilaterally. No wheezing, rales, or rhonchi. No respiratory distress. Skin: Warm, dry, intact. Neurological: Alert and oriented x3. No focal deficits.  Assessment Acute Bacterial Sinusitis  Acute Cough - R05.1   Plan Medications: Discontinue Augmentin  due to persistent symptoms. Initiate doxycycline  as prescribed. Dextromethorphan-guaifenesin  (DM) called into [facility] for cough management. Continue Flonase  nasal spray. Supportive Care: Encourage increased fluid intake. Rest as needed. May use acetaminophen  or ibuprofen as needed for pain or discomfort. Continue OTC sinus medications as tolerated. Patient Education: Discussed medication usage, side effects, and importance of completing antibiotic therapy. Educated regarding potential decreased pneumococcal coverage with doxycycline  compared to alternative agents. Shared decision-making utilized based on patient preference and prior positive response. Advised on symptomatic relief including humidified air and saline nasal rinses. Discussed warning signs requiring reevaluation.  Worsening acute illness with failure of initial antibiotic therapy requiring change in treatment plan and prescription medication management. Shared decision-making and risk discussion regarding antibiotic selection were performed.  Follow-Up Return if symptoms worsen or fail to  improve. Seek urgent care for fever, shortness of breath, chest pain, or worsening ear pain. Jon Bihari , NP

## 2024-04-23 ENCOUNTER — Telehealth: Payer: Self-pay

## 2024-04-23 ENCOUNTER — Other Ambulatory Visit: Payer: Self-pay

## 2024-04-23 MED ORDER — GUAIFENESIN-CODEINE 100-10 MG/5ML PO SOLN: 5.0000 mL | Freq: Three times a day (TID) | ORAL | 0 refills | Status: AC | PRN

## 2024-04-23 MED ORDER — ALBUTEROL SULFATE HFA 108 (90 BASE) MCG/ACT IN AERS: 2.0000 | INHALATION_SPRAY | Freq: Four times a day (QID) | RESPIRATORY_TRACT | 0 refills | Status: AC | PRN

## 2024-04-23 NOTE — Telephone Encounter (Signed)
 The patient called yesterday and is asking for cough medication that will knock him out. I spoke with Jon and she stated the patient is allergic to Morphine and could cause a reaction with taking Codeine . I called and talked with the patient and he stated he has taken the Codeine  cough medicine before with no allergic reactions and would like to continue with the requested medication.

## 2024-05-08 ENCOUNTER — Ambulatory Visit: Admitting: Internal Medicine
